# Patient Record
Sex: Male | Born: 1940 | Race: White | Hispanic: No | Marital: Married | Smoking: Former smoker
Health system: Southern US, Community
[De-identification: ages and names within clinical notes are randomized; demographics above are authoritative.]

## PROBLEM LIST (undated history)

## (undated) DIAGNOSIS — N471 Phimosis: Secondary | ICD-10-CM

## (undated) DIAGNOSIS — Z87828 Personal history of other (healed) physical injury and trauma: Secondary | ICD-10-CM

## (undated) DIAGNOSIS — I10 Essential (primary) hypertension: Secondary | ICD-10-CM

## (undated) DIAGNOSIS — N4889 Other specified disorders of penis: Secondary | ICD-10-CM

## (undated) DIAGNOSIS — R351 Nocturia: Secondary | ICD-10-CM

## (undated) DIAGNOSIS — C801 Malignant (primary) neoplasm, unspecified: Secondary | ICD-10-CM

## (undated) DIAGNOSIS — D649 Anemia, unspecified: Secondary | ICD-10-CM

## (undated) DIAGNOSIS — Z972 Presence of dental prosthetic device (complete) (partial): Secondary | ICD-10-CM

## (undated) DIAGNOSIS — R7303 Prediabetes: Secondary | ICD-10-CM

## (undated) HISTORY — DX: Anemia, unspecified: D64.9

## (undated) HISTORY — DX: Malignant (primary) neoplasm, unspecified: C80.1

---

## 2005-08-05 ENCOUNTER — Emergency Department (HOSPITAL_COMMUNITY): Admission: EM | Admit: 2005-08-05 | Discharge: 2005-08-05 | Payer: Self-pay | Admitting: *Deleted

## 2005-08-10 ENCOUNTER — Encounter: Admission: RE | Admit: 2005-08-10 | Discharge: 2005-08-10 | Payer: Self-pay | Admitting: Neurosurgery

## 2016-11-04 ENCOUNTER — Emergency Department (HOSPITAL_COMMUNITY)
Admission: EM | Admit: 2016-11-04 | Discharge: 2016-11-04 | Disposition: A | Discharge status: Home / Self Care | Payer: Medicare Other | Attending: Emergency Medicine | Admitting: Emergency Medicine

## 2016-11-04 ENCOUNTER — Encounter (HOSPITAL_COMMUNITY): Payer: Self-pay | Admitting: *Deleted

## 2016-11-04 DIAGNOSIS — N471 Phimosis: Secondary | ICD-10-CM | POA: Insufficient documentation

## 2016-11-04 DIAGNOSIS — N481 Balanitis: Secondary | ICD-10-CM

## 2016-11-04 DIAGNOSIS — N3000 Acute cystitis without hematuria: Secondary | ICD-10-CM | POA: Diagnosis not present

## 2016-11-04 DIAGNOSIS — N4889 Other specified disorders of penis: Secondary | ICD-10-CM | POA: Diagnosis present

## 2016-11-04 HISTORY — DX: Essential (primary) hypertension: I10

## 2016-11-04 LAB — URINALYSIS, ROUTINE W REFLEX MICROSCOPIC
Bilirubin Urine: NEGATIVE
Glucose, UA: NEGATIVE mg/dL
Hgb urine dipstick: NEGATIVE
Ketones, ur: NEGATIVE mg/dL
Nitrite: NEGATIVE
PROTEIN: 30 mg/dL — AB
Specific Gravity, Urine: 1.02 (ref 1.005–1.030)
pH: 5 (ref 5.0–8.0)

## 2016-11-04 MED ORDER — CEPHALEXIN 500 MG PO CAPS: 500.0000 mg | ORAL_CAPSULE | Freq: Four times a day (QID) | ORAL | 0 refills | Status: AC

## 2016-11-04 MED ORDER — CEPHALEXIN 250 MG PO CAPS
500.0000 mg | ORAL_CAPSULE | Freq: Once | ORAL | Status: AC
  Administered 2016-11-04: 500 mg via ORAL
  Filled 2016-11-04: qty 2

## 2016-11-04 MED ORDER — METRONIDAZOLE 500 MG PO TABS
500.0000 mg | ORAL_TABLET | Freq: Once | ORAL | Status: AC
  Administered 2016-11-04: 500 mg via ORAL
  Filled 2016-11-04: qty 1

## 2016-11-04 MED ORDER — METRONIDAZOLE 500 MG PO TABS: 500.0000 mg | ORAL_TABLET | Freq: Two times a day (BID) | ORAL | 0 refills | Status: AC

## 2016-11-04 NOTE — ED Notes (Signed)
PT states understanding of care given, follow up care, and medication prescribed. PT ambulated from ED to car with a steady gait. 

## 2016-11-04 NOTE — ED Triage Notes (Signed)
Pt is here with urinary symptoms and pain with urination.  Pt states thinks may have infection to penis since he is uncircumcised. No abdominal pain.

## 2016-11-04 NOTE — ED Provider Notes (Signed)
[facility] DEPT Provider Note   CSN: [id number] Arrival date & time: 11/04/16  1147     History   Chief Complaint Chief Complaint  Patient presents with  . URINARY SYMPTOMS AND PENIAL PAIN    HPI Thomas Wagner is a 76 y.o. male.  The history is provided by the patient, medical records and the spouse.  Dysuria   This is a new problem. The current episode started more than 2 days ago. The problem occurs every urination. The problem has not changed since onset.The quality of the pain is described as burning. The pain is moderate. There has been no fever. Pertinent negatives include no chills, no nausea, no vomiting, no discharge, no frequency, no hematuria, no hesitancy, no urgency and no flank pain. He has tried nothing for the symptoms. His past medical history does not include urological procedure or recurrent UTIs.    Past Medical History:  Diagnosis Date  . Hypertension     There are no active problems to display for this patient.   History reviewed. No pertinent surgical history.     Home Medications    Prior to Admission medications   Not on File    Family History No family history on file.  Social History Social History  Substance Use Topics  . Smoking status: Never Smoker  . Smokeless tobacco: Never Used  . Alcohol use No     Allergies   Patient has no known allergies.   Review of Systems Review of Systems  Constitutional: Negative for chills, diaphoresis and fever.  HENT: Negative for congestion.   Eyes: Negative for visual disturbance.  Respiratory: Negative for cough, chest tightness, shortness of breath and wheezing.   Cardiovascular: Negative for chest pain and palpitations.  Gastrointestinal: Negative for abdominal pain, constipation, diarrhea, nausea and vomiting.  Genitourinary: Positive for dysuria, penile pain and penile swelling. Negative for decreased urine volume, difficulty urinating, discharge, flank pain, frequency, genital  sores, hematuria, hesitancy, scrotal swelling, testicular pain and urgency.  Musculoskeletal: Negative for back pain, neck pain and neck stiffness.  Skin: Negative for rash and wound.  Neurological: Negative for headaches.  Psychiatric/Behavioral: Negative for agitation.  All other systems reviewed and are negative.    Physical Exam Updated Vital Signs BP (!) 190/90 (BP Location: Right Arm)   Pulse 80   Temp 98.5 F (36.9 C) (Oral)   Resp 18   SpO2 98%   Physical Exam  Constitutional: He appears well-developed and well-nourished. No distress.  HENT:  Head: Normocephalic and atraumatic.  Mouth/Throat: Oropharynx is clear and moist. No oropharyngeal exudate.  Eyes: Conjunctivae are normal.  Neck: Neck supple.  Cardiovascular: Normal rate and intact distal pulses.   No murmur heard. Pulmonary/Chest: Effort normal and breath sounds normal. No respiratory distress. He has no wheezes. He exhibits no tenderness.  Abdominal: Soft. There is no tenderness. Hernia confirmed negative in the right inguinal area and confirmed negative in the left inguinal area.  Genitourinary: Right testis shows no tenderness. Left testis shows no tenderness. Uncircumcised. Phimosis and penile tenderness present. No penile erythema. No discharge found.     Musculoskeletal: He exhibits no edema or tenderness.  Lymphadenopathy: No inguinal adenopathy noted on the right or left side.  Neurological: He is alert. No sensory deficit. He exhibits normal muscle tone.  Skin: Skin is warm and dry. Capillary refill takes less than 2 seconds. He is not diaphoretic. No erythema. No pallor.  Psychiatric: He has a normal mood and affect.  Nursing  note and vitals reviewed.    ED Treatments / Results  Labs (all labs ordered are listed, but only abnormal results are displayed) Labs Reviewed  URINALYSIS, ROUTINE W REFLEX MICROSCOPIC - Abnormal; Notable for the following:       Result Value   Protein, ur 30 (*)     Leukocytes, UA LARGE (*)    Bacteria, UA RARE (*)    Squamous Epithelial / LPF 0-5 (*)    All other components within normal limits    EKG  EKG Interpretation None       Radiology No results found.  Procedures Procedures (including critical care time)  Medications Ordered in ED Medications  cephALEXin (KEFLEX) capsule 500 mg (500 mg Oral Given 11/04/16 1640)  metroNIDAZOLE (FLAGYL) tablet 500 mg (500 mg Oral Given 11/04/16 1640)     Initial Impression / Assessment and Plan / ED Course  I have reviewed the triage vital signs and the nursing notes.  Pertinent labs & imaging results that were available during my care of the patient were reviewed by me and considered in my medical decision making (see chart for details).     Thomas Wagner is a 76 y.o. male with a past medical history significant for hypertension who presents with 1 week of dysuria and 6 weeks of swelling in his penis. Patient reports that he noticed a knot in his penis for the last 6 weeks. He says it is very tender and it occasionally bleeds when he tries to clean under his uncircumcised foreskin. He reports that over the last week he has had burning when he PEs at every urination. He says that it burned worse yesterday but is slightly improved today. He denies fevers, chills, nausea vomiting, abdominal pain, chest pain. He denies any productive cough or bowel symptoms. He denies any recent reticulocyte injuries. He denies any history of testicular injuries or abnormalities. He denies other complaints on arrival.  On exam, patient has no tenderness in the scrotum. No evidence of hernias. No abdominal tenderness. Lungs clear and chest nontender. No lower extremity abnormality. Patient had no tenderness of the shaft of the penis but had tenderness near the glans. Patient's foreskin was unable to be retracted. Patient says that he has not retracted his foreskin in "years". He denies any discharge but says that when he is  trying to clean under it bleeds. He reports it is tender when the glans is pressed through the foreskin and he occasionally feels a knot. No "knot" was palpated on my exam but there was tenderness of the glans. Exam was otherwise unremarkable. No erythema or ulcer seen. No evidence of Fournier's.  Based on exam, suspect patient has a balanitis and urinary tract infection. Patient also has a phimosis which is likely chronic as he reports he is not pulled back his foreskin in over a year.   Patient will be given prescription for antibiotics to treat both UTI and balanitis. Patient is still able to urinate normally, do not feel he needs urgent or emergent urological surgery consultation today however he does need evaluation by urology in clinic for further management recommendations. Patient reports that he will continue trying to clean the area and take his antibodies. Patient understood return precautions if he is unable to urinate or has worsening symptoms of infection. Next  Patient family understood the plan of care had no other questions or concerns. Patient discharged in good condition.    Final Clinical Impressions(s) / ED Diagnoses  Final diagnoses:  Acute cystitis without hematuria  Balanitis  Phimosis    New Prescriptions Discharge Medication List as of 11/04/2016  4:22 PM    START taking these medications   Details  cephALEXin (KEFLEX) 500 MG capsule Take 1 capsule (500 mg total) by mouth 4 (four) times daily., Starting Thu 11/04/2016, Until Thu 11/11/2016, Print    metroNIDAZOLE (FLAGYL) 500 MG tablet Take 1 tablet (500 mg total) by mouth 2 (two) times daily., Starting Thu 11/04/2016, Until Thu 11/11/2016, Print        Clinical Impression: 1. Acute cystitis without hematuria   2. Balanitis   3. Phimosis     Disposition: Discharge  Condition: Good  I have discussed the results, Dx and Tx plan with the pt(& family if present). He/she/they expressed understanding and agree(s)  with the plan. Discharge instructions discussed at great length. Strict return precautions discussed and pt &/or family have verbalized understanding of the instructions. No further questions at time of discharge.    Discharge Medication List as of 11/04/2016  4:22 PM    START taking these medications   Details  cephALEXin (KEFLEX) 500 MG capsule Take 1 capsule (500 mg total) by mouth 4 (four) times daily., Starting Thu 11/04/2016, Until Thu 11/11/2016, Print    metroNIDAZOLE (FLAGYL) 500 MG tablet Take 1 tablet (500 mg total) by mouth 2 (two) times daily., Starting Thu 11/04/2016, Until Thu 11/11/2016, Print        Follow Up: [place] [phone]    [facility] [place], [place] [id number] [phone]    [facility] EMERGENCY DEPARTMENT [address] [id number] mc [place] [phone]  If symptoms worsen       Evey Mcmahan, Gwenyth Allegra, MD 11/04/16 [phone]

## 2016-11-04 NOTE — Discharge Instructions (Signed)
Your workup today shows evidence of urinary tract infection and balanitis, inflammation/infection at the tip of the penis. You need to take her antibiotics and follow-up with a urologist. We are giving a referrals to 2 of the [facility] clinics to be called. You may also go to any urologist closer to your home. If any symptoms change or worsen or you begin developing any symptoms of systemic infection, please return to the nearest emergency department. Please try and keep your penis clean.

## 2016-12-02 ENCOUNTER — Other Ambulatory Visit: Payer: Self-pay | Admitting: Urology

## 2016-12-21 ENCOUNTER — Encounter (HOSPITAL_BASED_OUTPATIENT_CLINIC_OR_DEPARTMENT_OTHER): Payer: Self-pay | Admitting: *Deleted

## 2016-12-24 ENCOUNTER — Encounter (HOSPITAL_BASED_OUTPATIENT_CLINIC_OR_DEPARTMENT_OTHER): Payer: Self-pay | Admitting: *Deleted

## 2016-12-24 NOTE — Progress Notes (Signed)
NPO AFTER MN.  ARRIVE AT [id number].  NEEDS ISTAT 8 AND EKG.

## 2016-12-30 DIAGNOSIS — C801 Malignant (primary) neoplasm, unspecified: Secondary | ICD-10-CM

## 2016-12-30 HISTORY — DX: Malignant (primary) neoplasm, unspecified: C80.1

## 2016-12-30 NOTE — H&P (Signed)
HPI: Thomas Wagner is a 76 year-old male with phimosis and a penile mass.  He first noticed the symptoms 09/18/2016. His symptoms did begin gradually.   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 He does have difficulty retracting his foreskin.   11/23/16: He was seen in the ER on 11/04/16 with a one-week history of dysuria and a 6 week history of what he described as penile swelling. The dysuria was moderate in severity and he was found on exam to have a significant phimosis that he reported had been like that for several years. He was treated with an antibiotic for balanitis.  He also reported having a "knot" felt in the penis but no mass was palpable on exam in the ER. He was found to be tender on examination of the bladder and beneath the foreskin.     CC/HPI: I have trouble rolling my foreskin back.     11/23/16: He told me that he had not been able to retract his foreskin for many years. He states that he does not have diabetes. He said he used to have to clean inside the foreskin with a Q-tip and after doing so he saw some blood and felt a knot on the head of the penis beneath the foreskin. That was about 2 months ago. He said he believes it has varied in size. He denies any penile discharge. He has been placed on metronidazole, Keflex and clotrimazole.    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   Tobacco Use Assessment Completed: Used  Tobacco in last 30 days? Does not drink anymore.  Does not drink caffeine.    REVIEW OF SYSTEMS:    GU Review Male:   Patient reports get up at night to urinate and stream starts and stops. Patient denies frequent urination, hard to postpone urination, burning/ pain with urination, leakage of urine, trouble starting your stream, have to strain to urinate , erection problems, and penile pain.  Gastrointestinal (Upper):   Patient denies nausea, vomiting, and indigestion/ heartburn.  Gastrointestinal (Lower):   Patient denies diarrhea and constipation.  Constitutional:   Patient denies fever, night sweats, weight loss, and fatigue.  Skin:   Patient denies skin rash/ lesion and itching.  Eyes:   Patient denies blurred vision and double vision.  Ears/ Nose/ Throat:   Patient denies sore throat and sinus problems.  Hematologic/Lymphatic:   Patient reports easy bruising. Patient denies swollen glands.  Cardiovascular:   Patient denies leg swelling and chest pains.  Respiratory:   Patient denies cough and shortness of breath.  Endocrine:   Patient denies excessive thirst.  Musculoskeletal:   Patient denies back pain and joint pain.  Neurological:   Patient denies headaches and dizziness.  Psychologic:   Patient denies depression and anxiety.   VITAL SIGNS:     Weight 180 lb / 81.65 kg  Height 73 in / 185.42 cm  BP  146/79 mmHg  Pulse 82 /min  Temperature 98.1 F / 36.7 C  BMI 23.7 kg/m   GU PHYSICAL EXAMINATION:    Scrotum: No lesions. No edema. No cysts. No warts.  Epididymides: Right: no spermatocele, no masses, no cysts, no tenderness, no induration, no enlargement. Left: no spermatocele, no masses, no cysts, no tenderness, no induration, no enlargement.  Testes: No tenderness, no swelling, no enlargement left testes. No tenderness, no swelling, no enlargement right testes. Normal location left testes. Normal location right testes. No mass, no cyst, no varicocele, no hydrocele left testes.  No mass, no cyst, no varicocele, no hydrocele right testes.  Urethral Meatus: Normal size. No lesion, no wart, no discharge, no polyp. Normal location.  Penis: Penis uncircumcised, phimosis. 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 There is a 1 x 1.5 cm mass felt on the glans at about the 11 o'clock position. It seems fixed to the glans. It is nontender. There is no discharge.    MULTI-SYSTEM PHYSICAL EXAMINATION:    Constitutional: Well-nourished. No physical deformities. Normally developed. Good grooming.  Neck: Neck symmetrical, not swollen. Normal tracheal position.  Respiratory: No labored breathing, no use of accessory muscles.   Cardiovascular: Normal temperature, normal extremity pulses, no swelling, no varicosities.  Lymphatic: No enlargement of neck, axillae, groin.  Skin: No paleness, no jaundice, no cyanosis. No lesion, no ulcer, no rash.  Neurologic / Psychiatric: Oriented to time, oriented to place, oriented to person. No depression, no anxiety, no agitation.  Gastrointestinal: No mass, no tenderness, no rigidity, non obese abdomen.  Eyes: Normal conjunctivae. Normal eyelids.  Ears, Nose, Mouth, and Throat: Left ear no scars, no lesions, no masses. Right ear no scars, no lesions, no masses. Nose no scars, no lesions, no masses. Normal hearing. Normal lips.  Musculoskeletal: Normal gait and station of head and neck.     PAST DATA REVIEWED:  Source Of History:  Patient  Records Review:   Previous Hospital Records  Urine Test Review:   Urinalysis  Notes:                     His urinalysis in the emergency room was unremarkable other than TNTC wbc's.   PROCEDURES:          Urinalysis w/Scope Dipstick Dipstick Cont'd Micro  Color: Yellow Bilirubin: Neg WBC/hpf: 0 - 5/hpf  Appearance: Clear Ketones: Neg RBC/hpf: NS (Not Seen)  Specific Gravity: 1.025 Blood: Neg Bacteria: NS (Not Seen)  pH: 6.0 Protein: Trace Cystals: NS (Not Seen)   Glucose: Neg Urobilinogen: 1.0 Casts: NS (Not Seen)    Nitrites: Neg Trichomonas: Not Present    Leukocyte Esterase: Trace Mucous: Not Present      Epithelial Cells: 0 - 5/hpf      Yeast: NS (Not Seen)      Sperm: Not Present    ASSESSMENT/PLAN:     ICD-10 Details  1 GU:   Phimosis - N47.1 He has a severe phimosis that has been present for many years. 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 Because of a mass I have recommended circumcision for visualization and further management.  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 I cannot tell if there is any involvement of the glans as it cannot be visualized.  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 I discussed that with him. I told him that this would need to be biopsied once I was able to visualize the glans.   I went over the procedure planned with him in detail including the incision used, the risks and, occasions, the probability of success, the outpatient nature of the procedure as well as the anticipated postoperative course.

## 2017-01-03 ENCOUNTER — Ambulatory Visit (HOSPITAL_BASED_OUTPATIENT_CLINIC_OR_DEPARTMENT_OTHER): Payer: Medicare Other | Admitting: Anesthesiology

## 2017-01-03 ENCOUNTER — Encounter (HOSPITAL_BASED_OUTPATIENT_CLINIC_OR_DEPARTMENT_OTHER): Payer: Self-pay | Admitting: Anesthesiology

## 2017-01-03 ENCOUNTER — Encounter (HOSPITAL_BASED_OUTPATIENT_CLINIC_OR_DEPARTMENT_OTHER)
Admission: RE | Disposition: A | Discharge status: Home / Self Care | Payer: Self-pay | Source: Ambulatory Visit | Attending: Urology

## 2017-01-03 ENCOUNTER — Ambulatory Visit (HOSPITAL_BASED_OUTPATIENT_CLINIC_OR_DEPARTMENT_OTHER)
Admission: RE | Admit: 2017-01-03 | Discharge: 2017-01-03 | Disposition: A | Discharge status: Home / Self Care | Payer: Medicare Other | Source: Ambulatory Visit | Attending: Urology | Admitting: Urology

## 2017-01-03 DIAGNOSIS — Z85828 Personal history of other malignant neoplasm of skin: Secondary | ICD-10-CM | POA: Diagnosis not present

## 2017-01-03 DIAGNOSIS — Z79899 Other long term (current) drug therapy: Secondary | ICD-10-CM | POA: Insufficient documentation

## 2017-01-03 DIAGNOSIS — I10 Essential (primary) hypertension: Secondary | ICD-10-CM | POA: Insufficient documentation

## 2017-01-03 DIAGNOSIS — N480 Leukoplakia of penis: Secondary | ICD-10-CM | POA: Insufficient documentation

## 2017-01-03 DIAGNOSIS — C600 Malignant neoplasm of prepuce: Secondary | ICD-10-CM | POA: Insufficient documentation

## 2017-01-03 DIAGNOSIS — Z87891 Personal history of nicotine dependence: Secondary | ICD-10-CM | POA: Diagnosis not present

## 2017-01-03 DIAGNOSIS — N471 Phimosis: Secondary | ICD-10-CM | POA: Diagnosis not present

## 2017-01-03 HISTORY — DX: Nocturia: R35.1

## 2017-01-03 HISTORY — PX: CIRCUMCISION: SHX1350

## 2017-01-03 HISTORY — DX: Prediabetes: R73.03

## 2017-01-03 HISTORY — DX: Personal history of other (healed) physical injury and trauma: Z87.828

## 2017-01-03 HISTORY — DX: Phimosis: N47.1

## 2017-01-03 HISTORY — DX: Other specified disorders of penis: N48.89

## 2017-01-03 HISTORY — DX: Presence of dental prosthetic device (complete) (partial): Z97.2

## 2017-01-03 LAB — POCT I-STAT, CHEM 8
BUN: 9 mg/dL (ref 6–20)
Calcium, Ion: 1.14 mmol/L — ABNORMAL LOW (ref 1.15–1.40)
Chloride: 94 mmol/L — ABNORMAL LOW (ref 101–111)
Creatinine, Ser: 1 mg/dL (ref 0.61–1.24)
Glucose, Bld: 109 mg/dL — ABNORMAL HIGH (ref 65–99)
HCT: 49 % (ref 39.0–52.0)
Hemoglobin: 16.7 g/dL (ref 13.0–17.0)
Potassium: 4.1 mmol/L (ref 3.5–5.1)
Sodium: 133 mmol/L — ABNORMAL LOW (ref 135–145)
TCO2: 26 mmol/L (ref 22–32)

## 2017-01-03 SURGERY — CIRCUMCISION, ADULT
Anesthesia: General | Site: Penis

## 2017-01-03 MED ORDER — HYDROMORPHONE HCL 1 MG/ML IJ SOLN
0.2500 mg | INTRAMUSCULAR | Status: DC | PRN
  Filled 2017-01-03: qty 0.5

## 2017-01-03 MED ORDER — BUPIVACAINE HCL 0.5 % IJ SOLN
INTRAMUSCULAR | Status: DC | PRN
Start: 2017-01-03 — End: 2017-01-03
  Administered 2017-01-03: 10 mL

## 2017-01-03 MED ORDER — DEXAMETHASONE SODIUM PHOSPHATE 10 MG/ML IJ SOLN
INTRAMUSCULAR | Status: DC | PRN
  Administered 2017-01-03: 10 mg via INTRAVENOUS

## 2017-01-03 MED ORDER — ONDANSETRON HCL 4 MG/2ML IJ SOLN
INTRAMUSCULAR | Status: DC | PRN
  Administered 2017-01-03: 4 mg via INTRAVENOUS

## 2017-01-03 MED ORDER — PROPOFOL 10 MG/ML IV BOLUS
INTRAVENOUS | Status: AC
  Filled 2017-01-03: qty 20

## 2017-01-03 MED ORDER — CEFAZOLIN SODIUM-DEXTROSE 2-4 GM/100ML-% IV SOLN
2.0000 g | Freq: Once | INTRAVENOUS | Status: AC
  Administered 2017-01-03: 2 g via INTRAVENOUS
  Filled 2017-01-03: qty 100

## 2017-01-03 MED ORDER — PROPOFOL 10 MG/ML IV BOLUS
INTRAVENOUS | Status: DC | PRN
  Administered 2017-01-03: 160 mg via INTRAVENOUS

## 2017-01-03 MED ORDER — LIDOCAINE 2% (20 MG/ML) 5 ML SYRINGE
INTRAMUSCULAR | Status: AC
  Filled 2017-01-03: qty 5

## 2017-01-03 MED ORDER — CEFAZOLIN SODIUM-DEXTROSE 2-4 GM/100ML-% IV SOLN
INTRAVENOUS | Status: AC
  Filled 2017-01-03: qty 100

## 2017-01-03 MED ORDER — MIDAZOLAM HCL 2 MG/2ML IJ SOLN
INTRAMUSCULAR | Status: DC | PRN
  Administered 2017-01-03: 1 mg via INTRAVENOUS

## 2017-01-03 MED ORDER — FENTANYL CITRATE (PF) 100 MCG/2ML IJ SOLN
INTRAMUSCULAR | Status: DC | PRN
  Administered 2017-01-03: 25 ug via INTRAVENOUS
  Administered 2017-01-03: 50 ug via INTRAVENOUS

## 2017-01-03 MED ORDER — LIDOCAINE 2% (20 MG/ML) 5 ML SYRINGE
INTRAMUSCULAR | Status: DC | PRN
  Administered 2017-01-03: 60 mg via INTRAVENOUS

## 2017-01-03 MED ORDER — FENTANYL CITRATE (PF) 100 MCG/2ML IJ SOLN
INTRAMUSCULAR | Status: AC
  Filled 2017-01-03: qty 2

## 2017-01-03 MED ORDER — 0.9 % SODIUM CHLORIDE (POUR BTL) OPTIME
TOPICAL | Status: DC | PRN
  Administered 2017-01-03: 500 mL

## 2017-01-03 MED ORDER — BACITRACIN-NEOMYCIN-POLYMYXIN 400-5-5000 EX OINT
TOPICAL_OINTMENT | CUTANEOUS | Status: DC | PRN
  Administered 2017-01-03: 1 via TOPICAL

## 2017-01-03 MED ORDER — DEXAMETHASONE SODIUM PHOSPHATE 10 MG/ML IJ SOLN
INTRAMUSCULAR | Status: AC
  Filled 2017-01-03: qty 1

## 2017-01-03 MED ORDER — MIDAZOLAM HCL 2 MG/2ML IJ SOLN
INTRAMUSCULAR | Status: AC
  Filled 2017-01-03: qty 2

## 2017-01-03 MED ORDER — ONDANSETRON HCL 4 MG/2ML IJ SOLN
INTRAMUSCULAR | Status: AC
  Filled 2017-01-03: qty 2

## 2017-01-03 MED ORDER — PROMETHAZINE HCL 25 MG/ML IJ SOLN
6.2500 mg | INTRAMUSCULAR | Status: DC | PRN
  Filled 2017-01-03: qty 1

## 2017-01-03 MED ORDER — LACTATED RINGERS IV SOLN
INTRAVENOUS | Status: DC
  Administered 2017-01-03: 10:00:00 via INTRAVENOUS
  Filled 2017-01-03: qty 1000

## 2017-01-03 MED ORDER — OXYCODONE HCL 5 MG PO CAPS: 5.0000 mg | ORAL_CAPSULE | ORAL | 0 refills | Status: DC | PRN

## 2017-01-03 SURGICAL SUPPLY — 32 items
BANDAGE CO FLEX L/F 1IN X 5YD (GAUZE/BANDAGES/DRESSINGS) IMPLANT
BANDAGE CO FLEX L/F 2IN X 5YD (GAUZE/BANDAGES/DRESSINGS) ×2 IMPLANT
BLADE SURG 15 STRL LF DISP TIS (BLADE) ×1 IMPLANT
BLADE SURG 15 STRL SS (BLADE) ×2
COVER BACK TABLE 60X90IN (DRAPES) ×2 IMPLANT
COVER MAYO STAND STRL (DRAPES) ×2 IMPLANT
DRAIN PENROSE 18X1/4 LTX STRL (WOUND CARE) IMPLANT
DRAPE LAPAROTOMY 100X72 PEDS (DRAPES) ×2 IMPLANT
ELECT REM PT RETURN 9FT ADLT (ELECTROSURGICAL) ×2
ELECTRODE REM PT RTRN 9FT ADLT (ELECTROSURGICAL) ×1 IMPLANT
GAUZE SPONGE 4X4 12PLY STRL LF (GAUZE/BANDAGES/DRESSINGS) ×1 IMPLANT
GLOVE BIO SURGEON STRL SZ7 (GLOVE) ×1 IMPLANT
GLOVE BIO SURGEON STRL SZ7.5 (GLOVE) ×1 IMPLANT
GLOVE BIO SURGEON STRL SZ8 (GLOVE) ×2 IMPLANT
GLOVE BIOGEL PI IND STRL 7.5 (GLOVE) IMPLANT
GLOVE BIOGEL PI INDICATOR 7.5 (GLOVE) ×1
GOWN STRL REUS W/ TWL LRG LVL3 (GOWN DISPOSABLE) ×1 IMPLANT
GOWN STRL REUS W/ TWL XL LVL3 (GOWN DISPOSABLE) ×1 IMPLANT
GOWN STRL REUS W/TWL LRG LVL3 (GOWN DISPOSABLE) ×3 IMPLANT
GOWN STRL REUS W/TWL XL LVL3 (GOWN DISPOSABLE) ×4 IMPLANT
KIT RM TURNOVER CYSTO AR (KITS) ×2 IMPLANT
NEEDLE HYPO 22GX1.5 SAFETY (NEEDLE) ×2 IMPLANT
NS IRRIG 500ML POUR BTL (IV SOLUTION) ×1 IMPLANT
PACK BASIN DAY SURGERY FS (CUSTOM PROCEDURE TRAY) ×2 IMPLANT
PENCIL BUTTON HOLSTER BLD 10FT (ELECTRODE) ×2 IMPLANT
SUT CHROMIC 3 0 SH 27 (SUTURE) ×4 IMPLANT
SUT CHROMIC 4 0 RB 1X27 (SUTURE) IMPLANT
SUT CHROMIC 5 0 RB 1 27 (SUTURE) ×1 IMPLANT
SYR CONTROL 10ML LL (SYRINGE) ×2 IMPLANT
TOWEL OR 17X24 6PK STRL BLUE (TOWEL DISPOSABLE) ×4 IMPLANT
TRAY DSU PREP LF (CUSTOM PROCEDURE TRAY) ×2 IMPLANT
TUBE CONNECTING 12X1/4 (SUCTIONS) IMPLANT

## 2017-01-03 NOTE — Discharge Instructions (Signed)
Postoperative instructions for circumcision  Wound:  In most cases your incision will have absorbable sutures that run along the course of your incision and will dissolve within the first 10-20 days. Some will fall out even earlier. Expect some redness as the sutures dissolved but this should occur only around the sutures. If there is generalized redness, especially with increasing pain or swelling, let us know. The penis will very likely get "black and blue" as the blood in the tissues spread. Sometimes the whole penis will turn colors. The black and blue is followed by a yellow and brown color. In time, all the discoloration will go away.  Diet:  You may return to your normal diet within 24 hours following your surgery. You may note some mild nausea and possibly vomiting the first 6-8 hours following surgery. This is usually due to the side effects of anesthesia, and will disappear quite soon. I would suggest clear liquids and a very light meal the first evening following your surgery.  Activity:  Your physical activity should be restricted the first 48 hours. During that time you should remain relatively inactive, moving about only when necessary. During the first 7-10 days following surgery he should avoid lifting any heavy objects (anything greater than 15 pounds), and avoid strenuous exercise. If you work, ask [place] specifically about your restrictions, both for work and home. We will write a note to your employer if needed.  Ice packs can be placed on and off over the penis for the first 48 hours to help relieve the pain and keep the swelling down. Frozen peas or corn in a ZipLock bag can be frozen, used and re-frozen. Fifteen minutes on and 15 minutes off is a reasonable schedule.   Hygiene:  You may shower 48 hours after your surgery. Tub bathing should be restricted until the seventh day.  Medication:  You will be sent home with some type of pain medication. In many cases you will be  sent home with a narcotic pain pill (Vicodin or Tylox). If the pain is not too bad, you may take either Tylenol (acetaminophen) or Advil (ibuprofen) which contain no narcotic agents, and might be tolerated a little better, with fewer side effects. If the pain medication you are sent home with does not control the pain, you will have to let us know. Some narcotic pain medications cannot be given or refilled by a phone call to a pharmacy.  Problems you should report to [place]:   Fever of 101.0 degrees Fahrenheit or greater.  Moderate or severe swelling under the skin incision or involving the scrotum.  Drug reaction such as hives, a rash, nausea or vomiting.    Post Anesthesia Home Care Instructions  Activity: Get plenty of rest for the remainder of the day. A responsible individual must stay with you for 24 hours following the procedure.  For the next 24 hours, DO NOT: -Drive a car -Paediatric nurse -Drink alcoholic beverages -Take any medication unless instructed by your physician -Make any legal decisions or sign important papers.  Meals: Start with liquid foods such as gelatin or soup. Progress to regular foods as tolerated. Avoid greasy, spicy, heavy foods. If nausea and/or vomiting occur, drink only clear liquids until the nausea and/or vomiting subsides. Call your physician if vomiting continues.  Special Instructions/Symptoms: Your throat may feel dry or sore from the anesthesia or the breathing tube placed in your throat during surgery. If this causes discomfort, gargle with warm salt water. The discomfort  should disappear within 24 hours.  If you had a scopolamine patch placed behind your ear for the management of post- operative nausea and/or vomiting:  1. The medication in the patch is effective for 72 hours, after which it should be removed.  Wrap patch in a tissue and discard in the trash. Wash hands thoroughly with soap and water. 2. You may remove the patch earlier than 72  hours if you experience unpleasant side effects which may include dry mouth, dizziness or visual disturbances. 3. Avoid touching the patch. Wash your hands with soap and water after contact with the patch.    Post Anesthesia Home Care Instructions  Activity: Get plenty of rest for the remainder of the day. A responsible individual must stay with you for 24 hours following the procedure.  For the next 24 hours, DO NOT: -Drive a car -Paediatric nurse -Drink alcoholic beverages -Take any medication unless instructed by your physician -Make any legal decisions or sign important papers.  Meals: Start with liquid foods such as gelatin or soup. Progress to regular foods as tolerated. Avoid greasy, spicy, heavy foods. If nausea and/or vomiting occur, drink only clear liquids until the nausea and/or vomiting subsides. Call your physician if vomiting continues.  Special Instructions/Symptoms: Your throat may feel dry or sore from the anesthesia or the breathing tube placed in your throat during surgery. If this causes discomfort, gargle with warm salt water. The discomfort should disappear within 24 hours.  If you had a scopolamine patch placed behind your ear for the management of post- operative nausea and/or vomiting:  1. The medication in the patch is effective for 72 hours, after which it should be removed.  Wrap patch in a tissue and discard in the trash. Wash hands thoroughly with soap and water. 2. You may remove the patch earlier than 72 hours if you experience unpleasant side effects which may include dry mouth, dizziness or visual disturbances. 3. Avoid touching the patch. Wash your hands with soap and water after contact with the patch.

## 2017-01-03 NOTE — Anesthesia Procedure Notes (Signed)
Procedure Name: LMA Insertion Date/Time: 01/03/2017 11:05 AM Performed by: Suan Halter, CRNA Pre-anesthesia Checklist: Patient identified, Emergency Drugs available, Suction available and Patient being monitored Patient Re-evaluated:Patient Re-evaluated prior to induction Oxygen Delivery Method: Circle system utilized Preoxygenation: Pre-oxygenation with 100% oxygen Induction Type: IV induction Ventilation: Mask ventilation without difficulty LMA: LMA inserted LMA Size: 4.0 Number of attempts: 1 Airway Equipment and Method: Bite block Placement Confirmation: positive ETCO2 Tube secured with: Tape Dental Injury: Teeth and Oropharynx as per pre-operative assessment

## 2017-01-03 NOTE — Anesthesia Postprocedure Evaluation (Signed)
Anesthesia Post Note  Patient: Thomas Wagner  Procedure(s) Performed: CIRCUMCISION ADULT/ POSSIBLE PENILE BIOPSY (N/A Penis)     Patient location during evaluation: PACU Anesthesia Type: General Level of consciousness: sedated Pain management: pain level controlled Vital Signs Assessment: post-procedure vital signs reviewed and stable Respiratory status: spontaneous breathing and respiratory function stable Cardiovascular status: stable Postop Assessment: no apparent nausea or vomiting Anesthetic complications: no    Last Vitals:  Vitals:   01/03/17 1245 01/03/17 1300  BP: 136/80 134/74  Pulse: 77 79  Resp: 20 (!) 25  Temp:    SpO2: 95% 95%    Last Pain:  Vitals:   01/03/17 0837  TempSrc: Oral                 Butch Otterson DANIEL

## 2017-01-03 NOTE — Anesthesia Preprocedure Evaluation (Addendum)
Anesthesia Evaluation  Patient identified by MRN, date of birth, ID band Patient awake    Reviewed: Allergy & Precautions, NPO status , Patient's Chart, lab work & pertinent test results  History of Anesthesia Complications Negative for: history of anesthetic complications  Airway Mallampati: I  TM Distance: >3 FB Neck ROM: Full    Dental  (+) Upper Dentures, Dental Advisory Given   Pulmonary former smoker,    Pulmonary exam normal        Cardiovascular hypertension, Pt. on medications Normal cardiovascular exam     Neuro/Psych negative neurological ROS  negative psych ROS   GI/Hepatic negative GI ROS, Neg liver ROS,   Endo/Other  negative endocrine ROS  Renal/GU   negative genitourinary   Musculoskeletal negative musculoskeletal ROS (+)   Abdominal   Peds negative pediatric ROS (+)  Hematology negative hematology ROS (+)   Anesthesia Other Findings   Reproductive/Obstetrics negative OB ROS                           Anesthesia Physical Anesthesia Plan  ASA: II  Anesthesia Plan: General   Post-op Pain Management:    Induction: Intravenous  PONV Risk Score and Plan: 2 and Ondansetron, Dexamethasone and Treatment may vary due to age or medical condition  Airway Management Planned: LMA  Additional Equipment:   Intra-op Plan:   Post-operative Plan: Extubation in OR  Informed Consent: I have reviewed the patients History and Physical, chart, labs and discussed the procedure including the risks, benefits and alternatives for the proposed anesthesia with the patient or authorized representative who has indicated his/her understanding and acceptance.   Dental advisory given  Plan Discussed with: CRNA and Anesthesiologist  Anesthesia Plan Comments:        Anesthesia Quick Evaluation

## 2017-01-03 NOTE — Op Note (Signed)
PATIENT:  Thomas Wagner  PRE-OPERATIVE DIAGNOSIS: Phimosis, Penile lesion  POST-OPERATIVE DIAGNOSIS: Same  PROCEDURE: Circumcision  SURGEON:  Sequoyah Ramone  RESIDENT: Alla Feeling, MD, MD  INDICATION: Thomas Wagner is a 76 y.o. male with a history of phimosis and palpable penile lesion. Here today for circumcision. Risks and benefits discussed. Consent signed.   ANESTHESIA:  General  EBL:  Minimal  DRAINS: None  LOCAL MEDICATIONS USED: 5% Marcaine used as a dorsal penile block.  SPECIMEN:  Foreskin ( stitch marking penile lesion)   Description of procedure: After informed consent the patient was taken to the operating room and placed on the table in a supine position. General anesthesia was then administered. Once fully anesthetized the patient was moved to the supine position and the genitalia were sterilely prepped and draped in standard fashion. An official timeout was then performed.  We began by performing a dorsal slit, releasing the phimotic ring. We began the circumcision by making a circumferential incision 46mm proximal to the glans on the inner preputial skin, creating a preputial collar. We then measured, marked, and made our incision on the outer preputial skin. Next, using 4 hemostats on the dorsal aspect of the preputial skin for traction, we incised the preputial skin on the dorsal midline. Next, using electrocautery, we removed the preputial skin, ensuring not to cauterize the glans or penile skin. Once the foreskin was removed, we ensured excellent hemostasis and began closure.   Next, we re-approximated the inner and outer penile skin with 2 running 3-0 chromic sutures, one starting at 12 o'clock, and the other at 6 o'clock, running them counter clockwise.   After closure, we wrapped the penis in gauze, Kling, and Coban.   The patient tolerated the procedure well.    PLAN OF CARE: Discharge to home after PACU  PATIENT DISPOSITION:  PACU - hemodynamically  stable.   I was present, scrubbed in and assisted in all aspects of the surgery.

## 2017-01-03 NOTE — Transfer of Care (Signed)
Immediate Anesthesia Transfer of Care Note  Patient: Thomas Wagner  Procedure(s) Performed: Procedure(s) (LRB): CIRCUMCISION ADULT/ POSSIBLE PENILE BIOPSY (N/A)  Patient Location: PACU  Anesthesia Type: General  Level of Consciousness: awake, oriented, sedated and patient cooperative  Airway & Oxygen Therapy: Patient Spontanous Breathing and Patient connected to face mask oxygen  Post-op Assessment: Report given to PACU RN and Post -op Vital signs reviewed and stable  Post vital signs: Reviewed and stable  Complications: No apparent anesthesia complications  Last Vitals:  Vitals:   01/03/17 0837  BP: (!) 156/87  Pulse: 83  Resp: 18  Temp: 36.6 C  SpO2: 98%    Last Pain:  Vitals:   01/03/17 0837  TempSrc: Oral      Patients Stated Pain Goal: 5 (01/03/17 0922)

## 2017-01-04 ENCOUNTER — Encounter (HOSPITAL_BASED_OUTPATIENT_CLINIC_OR_DEPARTMENT_OTHER): Payer: Self-pay | Admitting: Urology

## 2017-01-11 ENCOUNTER — Other Ambulatory Visit: Payer: Self-pay

## 2017-01-11 ENCOUNTER — Encounter (HOSPITAL_COMMUNITY): Payer: Self-pay

## 2017-01-11 ENCOUNTER — Emergency Department (HOSPITAL_COMMUNITY): Payer: Medicare Other

## 2017-01-11 ENCOUNTER — Inpatient Hospital Stay (HOSPITAL_COMMUNITY)
Admission: EM | Admit: 2017-01-11 | Discharge: 2017-01-18 | DRG: 330 | Disposition: A | Discharge status: Home Health | Payer: Medicare Other | Attending: Internal Medicine | Admitting: Internal Medicine

## 2017-01-11 DIAGNOSIS — I10 Essential (primary) hypertension: Secondary | ICD-10-CM | POA: Diagnosis present

## 2017-01-11 DIAGNOSIS — Z800 Family history of malignant neoplasm of digestive organs: Secondary | ICD-10-CM

## 2017-01-11 DIAGNOSIS — R7303 Prediabetes: Secondary | ICD-10-CM | POA: Diagnosis present

## 2017-01-11 DIAGNOSIS — E861 Hypovolemia: Secondary | ICD-10-CM | POA: Diagnosis present

## 2017-01-11 DIAGNOSIS — Z95828 Presence of other vascular implants and grafts: Secondary | ICD-10-CM

## 2017-01-11 DIAGNOSIS — Z79899 Other long term (current) drug therapy: Secondary | ICD-10-CM

## 2017-01-11 DIAGNOSIS — Z87891 Personal history of nicotine dependence: Secondary | ICD-10-CM

## 2017-01-11 DIAGNOSIS — C799 Secondary malignant neoplasm of unspecified site: Secondary | ICD-10-CM

## 2017-01-11 DIAGNOSIS — D72829 Elevated white blood cell count, unspecified: Secondary | ICD-10-CM | POA: Diagnosis present

## 2017-01-11 DIAGNOSIS — C187 Malignant neoplasm of sigmoid colon: Principal | ICD-10-CM

## 2017-01-11 DIAGNOSIS — K6389 Other specified diseases of intestine: Secondary | ICD-10-CM

## 2017-01-11 DIAGNOSIS — C186 Malignant neoplasm of descending colon: Secondary | ICD-10-CM

## 2017-01-11 DIAGNOSIS — R933 Abnormal findings on diagnostic imaging of other parts of digestive tract: Secondary | ICD-10-CM | POA: Diagnosis present

## 2017-01-11 DIAGNOSIS — C4492 Squamous cell carcinoma of skin, unspecified: Secondary | ICD-10-CM

## 2017-01-11 DIAGNOSIS — C800 Disseminated malignant neoplasm, unspecified: Secondary | ICD-10-CM | POA: Diagnosis present

## 2017-01-11 DIAGNOSIS — R1032 Left lower quadrant pain: Secondary | ICD-10-CM

## 2017-01-11 DIAGNOSIS — N5089 Other specified disorders of the male genital organs: Secondary | ICD-10-CM | POA: Diagnosis present

## 2017-01-11 DIAGNOSIS — C609 Malignant neoplasm of penis, unspecified: Secondary | ICD-10-CM | POA: Diagnosis present

## 2017-01-11 DIAGNOSIS — A09 Infectious gastroenteritis and colitis, unspecified: Secondary | ICD-10-CM | POA: Diagnosis present

## 2017-01-11 DIAGNOSIS — E871 Hypo-osmolality and hyponatremia: Secondary | ICD-10-CM

## 2017-01-11 DIAGNOSIS — D630 Anemia in neoplastic disease: Secondary | ICD-10-CM | POA: Diagnosis present

## 2017-01-11 DIAGNOSIS — R180 Malignant ascites: Secondary | ICD-10-CM | POA: Diagnosis present

## 2017-01-11 DIAGNOSIS — Z79891 Long term (current) use of opiate analgesic: Secondary | ICD-10-CM

## 2017-01-11 DIAGNOSIS — Z791 Long term (current) use of non-steroidal anti-inflammatories (NSAID): Secondary | ICD-10-CM

## 2017-01-11 DIAGNOSIS — K633 Ulcer of intestine: Secondary | ICD-10-CM | POA: Diagnosis present

## 2017-01-11 LAB — COMPREHENSIVE METABOLIC PANEL
ALBUMIN: 3.3 g/dL — AB (ref 3.5–5.0)
ALT: 18 U/L (ref 17–63)
AST: 26 U/L (ref 15–41)
Alkaline Phosphatase: 77 U/L (ref 38–126)
Anion gap: 11 (ref 5–15)
BUN: 12 mg/dL (ref 6–20)
CALCIUM: 8.7 mg/dL — AB (ref 8.9–10.3)
CO2: 22 mmol/L (ref 22–32)
CREATININE: 0.99 mg/dL (ref 0.61–1.24)
Chloride: 96 mmol/L — ABNORMAL LOW (ref 101–111)
GFR calc Af Amer: >60 mL/min (ref >60)
Glucose, Bld: 117 mg/dL — ABNORMAL HIGH (ref 65–99)
Potassium: 4 mmol/L (ref 3.5–5.1)
Sodium: 129 mmol/L — ABNORMAL LOW (ref 135–145)
TOTAL PROTEIN: 7 g/dL (ref 6.5–8.1)
Total Bilirubin: 0.5 mg/dL (ref 0.3–1.2)

## 2017-01-11 LAB — LIPASE, BLOOD: LIPASE: 23 U/L (ref 11–51)

## 2017-01-11 LAB — URINALYSIS, ROUTINE W REFLEX MICROSCOPIC
Bilirubin Urine: NEGATIVE
Glucose, UA: NEGATIVE mg/dL
Hgb urine dipstick: NEGATIVE
Ketones, ur: 5 mg/dL — AB
Leukocytes, UA: NEGATIVE
Nitrite: NEGATIVE
Protein, ur: NEGATIVE mg/dL
SPECIFIC GRAVITY, URINE: 1.017 (ref 1.005–1.030)
pH: 7 (ref 5.0–8.0)

## 2017-01-11 LAB — CBC
HCT: 39.2 % (ref 39.0–52.0)
Hemoglobin: 14 g/dL (ref 13.0–17.0)
MCH: 29.4 pg (ref 26.0–34.0)
MCHC: 35.7 g/dL (ref 30.0–36.0)
MCV: 82.2 fL (ref 78.0–100.0)
PLATELETS: 382 10*3/uL (ref 150–400)
RBC: 4.77 MIL/uL (ref 4.22–5.81)
RDW: 12.8 % (ref 11.5–15.5)
WBC: 10.2 10*3/uL (ref 4.0–10.5)

## 2017-01-11 MED ORDER — IOPAMIDOL (ISOVUE-300) INJECTION 61%
INTRAVENOUS | Status: AC
  Filled 2017-01-11: qty 100

## 2017-01-11 MED ORDER — IOPAMIDOL (ISOVUE-300) INJECTION 61%
100.0000 mL | Freq: Once | INTRAVENOUS | Status: AC | PRN
  Administered 2017-01-11: 100 mL via INTRAVENOUS

## 2017-01-11 MED ORDER — OXYCODONE HCL 5 MG PO TABS
5.0000 mg | ORAL_TABLET | ORAL | Status: DC | PRN
  Administered 2017-01-12 – 2017-01-17 (×9): 5 mg via ORAL
  Filled 2017-01-11 (×9): qty 1

## 2017-01-11 MED ORDER — SODIUM CHLORIDE 0.9 % IV BOLUS (SEPSIS)
1000.0000 mL | Freq: Once | INTRAVENOUS | Status: AC
  Administered 2017-01-11: 1000 mL via INTRAVENOUS

## 2017-01-11 MED ORDER — AMLODIPINE BESYLATE 5 MG PO TABS
2.5000 mg | ORAL_TABLET | Freq: Every evening | ORAL | Status: DC
  Administered 2017-01-12: 2.5 mg via ORAL
  Filled 2017-01-11: qty 1

## 2017-01-11 MED ORDER — DEXTROSE-NACL 5-0.9 % IV SOLN
INTRAVENOUS | Status: DC
  Administered 2017-01-11 – 2017-01-15 (×8): via INTRAVENOUS

## 2017-01-11 MED ORDER — ONDANSETRON HCL 4 MG/2ML IJ SOLN: 4.0000 mg | Freq: Four times a day (QID) | INTRAMUSCULAR | Status: DC | PRN

## 2017-01-11 MED ORDER — ONDANSETRON HCL 4 MG PO TABS: 4.0000 mg | ORAL_TABLET | Freq: Four times a day (QID) | ORAL | Status: DC | PRN

## 2017-01-11 MED ORDER — ACETAMINOPHEN 650 MG RE SUPP: 650.0000 mg | Freq: Four times a day (QID) | RECTAL | Status: DC | PRN

## 2017-01-11 MED ORDER — BENAZEPRIL HCL 10 MG PO TABS
10.0000 mg | ORAL_TABLET | Freq: Every evening | ORAL | Status: DC
  Administered 2017-01-12 – 2017-01-17 (×6): 10 mg via ORAL
  Filled 2017-01-11 (×6): qty 1

## 2017-01-11 MED ORDER — ACETAMINOPHEN 325 MG PO TABS: 650.0000 mg | ORAL_TABLET | Freq: Four times a day (QID) | ORAL | Status: DC | PRN

## 2017-01-11 NOTE — ED Provider Notes (Signed)
[facility] DEPT Provider Note   CSN: [id number] Arrival date & time: 01/11/17  1225     History   Chief Complaint Chief Complaint  Patient presents with  . Abdominal Pain    HPI Thomas Wagner is a 76 y.o. male.  HPI   76 year old male presents today with complaints of nausea vomiting diarrhea and abdominal pain.  Patient reports a 6-week history of feeling vague illness with minor left lower quadrant abdominal pain.  He notes over the last several weeks symptoms have worsened, and the last 2 days have been unbearable.  He notes he has had several episodes of vomiting most recently 2 days ago.  He notes every time he tries to eat he feels as if he is going to vomit and has not been able to tolerate much p.o.  Patient reports that he is also having very loose stools.  He has been seen by his primary care provider who diagnosed him with diverticulitis and started him on 2 unknown antibiotics.  Patient notes that he took a dose 3 days ago, but has been unable to tolerate the medications over the last several days.  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  She denies any history of abdominal surgeries.  He denies any fever or weight loss.  Pt reports no previous colonoscopy.    Past Medical History:  Diagnosis Date  . History of traumatic subdural hematoma    08-05-2005  --- pt fell off roof--- per imaging right posteroparietal-right parafalcine subdural hematoma w/ concussion --- per pt no residual  . Hypertension   . Nocturia   . Penile mass   . Phimosis   . Pre-diabetes   . Wears dentures    upper    There are no active problems to display for this patient.   Past Surgical History:  Procedure Laterality Date  . NO PAST SURGERIES         Home Medications    Prior to Admission medications   Medication Sig Start Date End Date Taking? Authorizing Provider  amLODipine (NORVASC)  5 MG tablet Take 2.5 mg by mouth every evening.   Yes [provider]  benazepril (LOTENSIN) 20 MG tablet Take 10 mg by mouth every evening.   Yes [provider]  cephALEXin (KEFLEX) 500 MG capsule Take 500 mg 4 (four) times daily by mouth.   Yes [provider]  CINNAMON PO Take by mouth.   Yes [provider]  ciprofloxacin (CIPRO) 500 MG tablet Take 500 mg 2 (two) times daily by mouth. 01/06/17  Yes [provider]  GARLIC PO Take by mouth.   Yes [provider]  ibuprofen (ADVIL,MOTRIN) 200 MG tablet Take 400 mg 2 (two) times daily as needed by mouth.   Yes [provider]  metroNIDAZOLE (FLAGYL) 500 MG tablet Take 500 mg 3 (three) times daily by mouth. 11/15/16  Yes [provider]  oxycodone (OXY-IR) 5 MG capsule Take 1-2 capsules (5-10 mg total) every 4 (four) hours as needed by mouth. 01/03/17   Alla Feeling, MD    Family History History reviewed. No pertinent family history.  Social History Social History   Tobacco Use  . Smoking status: Former Smoker    Years: 5.00    Types: Cigarettes    Last attempt to quit: 12/24/1973    Years since quitting: 43.0  . Smokeless tobacco: Former Systems developer  Types: Sarina Ser    Quit date: 12/25/1978  Substance Use Topics  . Alcohol use: No  . Drug use: No     Allergies   Patient has no known allergies.   Review of Systems Review of Systems  All other systems reviewed and are negative.    Physical Exam Updated Vital Signs BP 130/84 (BP Location: Left Arm)   Pulse 98   Temp 97.8 F (36.6 C) (Oral)   Resp 18   Ht 6' (1.829 m)   Wt 81.6 kg (180 lb)   SpO2 97%   BMI 24.41 kg/m   Physical Exam  Constitutional: He is oriented to person, place, and time. He appears well-developed and well-nourished.  HENT:  Head: Normocephalic and atraumatic.  Eyes: Conjunctivae are normal. Pupils are equal, round, and reactive to light. Right eye exhibits no discharge. Left  eye exhibits no discharge. No scleral icterus.  Neck: Normal range of motion. No JVD present. No tracheal deviation present.  Pulmonary/Chest: Effort normal. No stridor.  Abdominal: Bowel sounds are normal. He exhibits distension. There is tenderness in the left lower quadrant.  Neurological: He is alert and oriented to person, place, and time. Coordination normal.  Psychiatric: He has a normal mood and affect. His behavior is normal. Judgment and thought content normal.  Nursing note and vitals reviewed.    ED Treatments / Results  Labs (all labs ordered are listed, but only abnormal results are displayed) Labs Reviewed  COMPREHENSIVE METABOLIC PANEL - Abnormal; Notable for the following components:      Result Value   Sodium 129 (*)    Chloride 96 (*)    Glucose, Bld 117 (*)    Calcium 8.7 (*)    Albumin 3.3 (*)    All other components within normal limits  URINALYSIS, ROUTINE W REFLEX MICROSCOPIC - Abnormal; Notable for the following components:   Color, Urine STRAW (*)    Ketones, ur 5 (*)    All other components within normal limits  LIPASE, BLOOD  CBC    EKG  EKG Interpretation None       Radiology Ct Abdomen Pelvis W Contrast  Result Date: 01/11/2017 CLINICAL DATA:  Abdominal pain, cramping, bloating and diarrhea for 1 month. EXAM: CT ABDOMEN AND PELVIS WITH CONTRAST TECHNIQUE: Multidetector CT imaging of the abdomen and pelvis was performed using the standard protocol following bolus administration of intravenous contrast. CONTRAST:  191mL ISOVUE-300 IOPAMIDOL (ISOVUE-300) INJECTION 61% COMPARISON:  None. FINDINGS: Lower chest: Small dependent left pleural effusion. Mild dependent left lower lobe atelectasis. Coronary atherosclerosis. Hepatobiliary: Normal liver with no liver mass. Normal gallbladder with no radiopaque cholelithiasis. No biliary ductal dilatation. Pancreas: Normal, with no mass or duct dilation. Spleen: Normal size. No mass. Adrenals/Urinary Tract: No  discrete adrenal nodules. No hydronephrosis. Small simple parapelvic renal cysts in both kidneys. No suspicious renal masses. Normal bladder. Stomach/Bowel: Small hiatal hernia. Otherwise normal nondistended stomach. Normal caliber small bowel with no small bowel wall thickening. Normal appendix. There is an irregular annular mass in the sigmoid colon measuring approximately 6.2 x 3.6 cm (series 2/ image 79). There is moderate left colonic diverticulosis. No significant large bowel dilatation. No additional sites of large bowel wall thickening. Vascular/Lymphatic: Atherosclerotic nonaneurysmal abdominal aorta. Patent portal, splenic, hepatic and renal veins. There are several clustered mildly enlarged lymph nodes in the sigmoid mesentery measuring up to 1.4 cm (series 2/ image 75). Reproductive: Top-normal size prostate. Other: There is extensive soft tissue caking throughout the omentum. Moderate volume  ascites. Smooth peritoneal thickening and enhancement throughout the peritoneal cavity. Musculoskeletal: No aggressive appearing focal osseous lesions. Marked thoracolumbar spondylosis. IMPRESSION: 1. Irregular annular 6.2 x 3.6 cm mass in the sigmoid colon suspicious for primary colonic malignancy. 2. Multiple mildly enlarged lymph nodes in the sigmoid mesenteries suspicious for locoregional nodal metastases. 3. Extensive soft tissue caking throughout the omentum. Smooth peritoneal thickening and enhancement throughout the peritoneal cavity. Moderate volume ascites. 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 4. Small dependent left pleural effusion. 5.  Aortic Atherosclerosis (ICD10-I70.0).  Coronary atherosclerosis. Electronically Signed   By: Ilona Sorrel M.D.   On: 01/11/2017 16:31    Procedures Procedures (including critical care time)  Medications Ordered in ED Medications  iopamidol (ISOVUE-300) 61 % injection (not administered)  sodium chloride 0.9 % bolus 1,000 mL (0 mLs  Intravenous Stopped 01/11/17 1533)  iopamidol (ISOVUE-300) 61 % injection 100 mL (100 mLs Intravenous Contrast Given 01/11/17 1541)     Initial Impression / Assessment and Plan / ED Course  I have reviewed the triage vital signs and the nursing notes.  Pertinent labs & imaging results that were available during my care of the patient were reviewed by me and considered in my medical decision making (see chart for details).      Final Clinical Impressions(s) / ED Diagnoses   Final diagnoses:  Left lower quadrant pain  Malignant neoplasm of sigmoid colon (HCC)     Labs:  CBC, CMP, lipase, UA  Imaging:   Consults: Dr. Havery Moros [facility], [facility]   Therapeutics:  Discharge Meds:   Assessment/Plan: 76 year old male presents today with left lower quadrant abdominal pain, nausea vomiting and diarrhea.  Patient is afebrile at this time but he is slightly tachycardic.  Basic labs CT scan will be ordered with concern for diverticulitis.  No signs of obstruction on exam.  Patient's imaging returned showing likely cancerous mass in the sigmoid colon with metastatic disease.  Case was discussed with on-call gastroenterologist Dr. Havery Moros.  Recommend patient be brought into the hospital given inability to tolerate p.o., gastroenterology will see the patient in the morning.  Patient will likely be unable to tolerate bowel prep, n.p.o. after midnight.  I discussed this with the patient and family, they agreed for hospital admission for ongoing evaluation and management.  Patient is resting comfortably at this time.  Hospitalist service consulted who agreed for hospital admission.      ED Discharge Orders    None       Okey Regal, PA-C 01/11/17 1743    Drenda Freeze, MD 01/12/17 [phone]

## 2017-01-11 NOTE — H&P (Signed)
History and Physical    [name]:[id number] DOB: Apr 03, 1940 DOA: 01/11/2017  PCP: [name], MD  Patient coming from: home  I have personally briefly reviewed patient's old medical records in [place]  Chief Complaint: Nausea, vomiting, and abdominal pain  HPI: Thomas Wagner is Thomas Wagner 76 y.o. male with medical history significant of hypertension, squamous cell carcinoma presenting with decreased appetite, nausea, abdominal pain.  Patient notes that his symptoms started 2-3 weeks ago.  He notes that he noticed decreased appetite at that period of time.  He is also noticed left lower quadrant abdominal pain.  He describes as being constant, achy.  He notes that he can wear [name] belt anymore because it bothers him.  Having his hands loose feels better.  He denies any fevers, chills, cough, cold, chest pain, shortness of breath.  He does note loose stools recently.  He has never had [name] colonoscopy.  He recently had Issabelle Mcraney biopsy of his foreskin with urology that was positive for squamous cell carcinoma.  ED Course: In the emergency department the patient had labs, imaging concerning for colon cancer with metastatic disease.  Plan was to admit given inability for patient to tolerate p.o. likely scope in the morning.  Review of Systems: As per HPI otherwise 10 point review of systems negative.   Past Medical History:  Diagnosis Date  . History of traumatic subdural hematoma    08-05-2005  --- pt fell off roof--- per imaging right posteroparietal-right parafalcine subdural hematoma w/ concussion --- per pt no residual  . Hypertension   . Nocturia   . Penile mass   . Phimosis   . Pre-diabetes   . Wears dentures    upper    Past Surgical History:  Procedure Laterality Date  . NO PAST SURGERIES       reports that he quit smoking about 43 years ago. His smoking use included cigarettes. He quit after 5.00 years of use. He quit smokeless tobacco use about 38 years ago. His smokeless  tobacco use included chew. He reports that he does not drink alcohol or use drugs.  No Known Allergies  History reviewed. No pertinent family history.  Prior to Admission medications   Medication Sig Start Date End Date Taking? Authorizing Provider  amLODipine (NORVASC) 5 MG tablet Take 2.5 mg by mouth every evening.   Yes [provider]  benazepril (LOTENSIN) 20 MG tablet Take 10 mg by mouth every evening.   Yes [provider]  cephALEXin (KEFLEX) 500 MG capsule Take 500 mg 4 (four) times daily by mouth.   Yes [provider]  CINNAMON PO Take by mouth.   Yes [provider]  ciprofloxacin (CIPRO) 500 MG tablet Take 500 mg 2 (two) times daily by mouth. 01/06/17  Yes [provider]  GARLIC PO Take by mouth.   Yes [provider]  ibuprofen (ADVIL,MOTRIN) 200 MG tablet Take 400 mg 2 (two) times daily as needed by mouth.   Yes [provider]  metroNIDAZOLE (FLAGYL) 500 MG tablet Take 500 mg 3 (three) times daily by mouth. 11/15/16  Yes [provider]  oxycodone (OXY-IR) 5 MG capsule Take 1-2 capsules (5-10 mg total) every 4 (four) hours as needed by mouth. 01/03/17   Alla Feeling, MD    Physical Exam: Vitals:   01/11/17 1237 01/11/17 1241 01/11/17 1409  BP: (!) 164/103  130/84  Pulse: (!) 116  98  Resp: 18    Temp:  97.8 F (36.6 C)    TempSrc: Oral    SpO2: 97%    Weight:  81.6 kg (180 lb)   Height:  6' (1.829 m)     Constitutional: NAD, calm, comfortable Vitals:   01/11/17 1237 01/11/17 1241 01/11/17 1409  BP: (!) 164/103  130/84  Pulse: (!) 116  98  Resp: 18    Temp: 97.8 F (36.6 C)    TempSrc: Oral    SpO2: 97%    Weight:  81.6 kg (180 lb)   Height:  6' (1.829 m)    Eyes: PERRL, lids and conjunctivae normal ENMT: Mucous membranes are moist. Posterior pharynx clear of any exudate or lesions.Normal dentition.  Neck: normal, supple, no masses, no thyromegaly Respiratory: clear to auscultation  bilaterally, no wheezing, no crackles. Normal respiratory effort. No accessory muscle use.  Cardiovascular: Regular rate and rhythm, no murmurs / rubs / gallops. No extremity edema. 2+ pedal pulses. No carotid bruits.  Abdomen: mild LLQ tenderness, no masses palpated. No hepatosplenomegaly. Bowel sounds positive.  Musculoskeletal: no clubbing / cyanosis. No joint deformity upper and lower extremities. Good ROM, no contractures. Normal muscle tone.  Skin: no rashes, lesions, ulcers. No induration Neurologic: CN 2-12 grossly intact. Sensation intact, DTR normal. Strength 5/5 in all 4.  Psychiatric: Normal judgment and insight. Alert and oriented x 3. Normal mood.   Labs on Admission: I have personally reviewed following labs and imaging studies  CBC: Recent Labs  Lab 01/11/17 1359  WBC 10.2  HGB 14.0  HCT 39.2  MCV 82.2  PLT 272   Basic Metabolic Panel: Recent Labs  Lab 01/11/17 1359  NA 129*  K 4.0  CL 96*  CO2 22  GLUCOSE 117*  BUN 12  CREATININE 0.99  CALCIUM 8.7*   GFR: Estimated Creatinine Clearance: 69.7 mL/min (by C-G formula based on SCr of 0.99 mg/dL). Liver Function Tests: Recent Labs  Lab 01/11/17 1359  AST 26  ALT 18  ALKPHOS 77  BILITOT 0.5  PROT 7.0  ALBUMIN 3.3*   Recent Labs  Lab 01/11/17 1359  LIPASE 23   No results for input(s): AMMONIA in the last 168 hours. Coagulation Profile: No results for input(s): INR, PROTIME in the last 168 hours. Cardiac Enzymes: No results for input(s): CKTOTAL, CKMB, CKMBINDEX, TROPONINI in the last 168 hours. BNP (last 3 results) No results for input(s): PROBNP in the last 8760 hours. HbA1C: No results for input(s): HGBA1C in the last 72 hours. CBG: No results for input(s): GLUCAP in the last 168 hours. Lipid Profile: No results for input(s): CHOL, HDL, LDLCALC, TRIG, CHOLHDL, LDLDIRECT in the last 72 hours. Thyroid Function Tests: No results for input(s): TSH, T4TOTAL, FREET4, T3FREE, THYROIDAB in the last  72 hours. Anemia Panel: No results for input(s): VITAMINB12, FOLATE, FERRITIN, TIBC, IRON, RETICCTPCT in the last 72 hours. Urine analysis:    Component Value Date/Time   COLORURINE STRAW (Lowry Bala) 01/11/2017 1600   APPEARANCEUR CLEAR 01/11/2017 1600   LABSPEC 1.017 01/11/2017 1600   PHURINE 7.0 01/11/2017 1600   GLUCOSEU NEGATIVE 01/11/2017 1600   HGBUR NEGATIVE 01/11/2017 1600   BILIRUBINUR NEGATIVE 01/11/2017 1600   KETONESUR 5 (Carlitos Bottino) 01/11/2017 1600   PROTEINUR NEGATIVE 01/11/2017 1600   NITRITE NEGATIVE 01/11/2017 1600   LEUKOCYTESUR NEGATIVE 01/11/2017 1600    Radiological Exams on Admission: Ct Abdomen Pelvis W Contrast  Result Date: 01/11/2017 CLINICAL DATA:  Abdominal pain, cramping, bloating and diarrhea for 1 month. EXAM: CT ABDOMEN AND PELVIS WITH CONTRAST TECHNIQUE:  Multidetector CT imaging of the abdomen and pelvis was performed using the standard protocol following bolus administration of intravenous contrast. CONTRAST:  189mL ISOVUE-300 IOPAMIDOL (ISOVUE-300) INJECTION 61% COMPARISON:  None. FINDINGS: Lower chest: Small dependent left pleural effusion. Mild dependent left lower lobe atelectasis. Coronary atherosclerosis. Hepatobiliary: Normal liver with no liver mass. Normal gallbladder with no radiopaque cholelithiasis. No biliary ductal dilatation. Pancreas: Normal, with no mass or duct dilation. Spleen: Normal size. No mass. Adrenals/Urinary Tract: No discrete adrenal nodules. No hydronephrosis. Small simple parapelvic renal cysts in both kidneys. No suspicious renal masses. Normal bladder. Stomach/Bowel: Small hiatal hernia. Otherwise normal nondistended stomach. Normal caliber small bowel with no small bowel wall thickening. Normal appendix. There is an irregular annular mass in the sigmoid colon measuring approximately 6.2 x 3.6 cm (series 2/ image 79). There is moderate left colonic diverticulosis. No significant large bowel dilatation. No additional sites of large bowel wall  thickening. Vascular/Lymphatic: Atherosclerotic nonaneurysmal abdominal aorta. Patent portal, splenic, hepatic and renal veins. There are several clustered mildly enlarged lymph nodes in the sigmoid mesentery measuring up to 1.4 cm (series 2/ image 75). Reproductive: Top-normal size prostate. Other: There is extensive soft tissue caking throughout the omentum. Moderate volume ascites. Smooth peritoneal thickening and enhancement throughout the peritoneal cavity. Musculoskeletal: No aggressive appearing focal osseous lesions. Marked thoracolumbar spondylosis. IMPRESSION: 1. Irregular annular 6.2 x 3.6 cm mass in the sigmoid colon suspicious for primary colonic malignancy. 2. Multiple mildly enlarged lymph nodes in the sigmoid mesenteries suspicious for locoregional nodal metastases. 3. Extensive soft tissue caking throughout the omentum. Smooth peritoneal thickening and enhancement throughout the peritoneal cavity. Moderate volume ascites. 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 4. Small dependent left pleural effusion. 5.  Aortic Atherosclerosis (ICD10-I70.0).  Coronary atherosclerosis. Electronically Signed   By: Ilona Sorrel M.D.   On: 01/11/2017 16:31    EKG: Independently reviewed. 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  Likely sigmoidoscopy tomorrow with biopsy. Oncology c/s (not called tonight, will need to touch base tomorrow) Antiemetics, pain control  Nausea, vomiting  Decreased PO:  Nutrition c/s IVF and antiemetics as above ADAT after procedure  Hyponatremia: Likely hypovolemic given above.  F/u repeat labs.  HTN:  Home  amlodpine and benazepril  Squamous Cell Carcinoma:  Recently dx with biopsy last week F/u outpatient   DVT prophylaxis: SCD's given procedure tomorrow  Code Status: full code  Family Communication: wife, daughters at bedside  Disposition Plan: pending biopsy and ability to tolerate PO  Consults called: GI  Admission status: obs    Fayrene Helper MD [facility] Pager [phone]  If 7PM-7AM, please contact night-coverage [email or web address] Password [facility]  01/11/2017, 6:55 PM

## 2017-01-11 NOTE — ED Triage Notes (Signed)
Patient presents with abdominal pain, cramping, bloating, and diarrhea for 1 month. Patient reports seeing his PCP on Thursday for the issue and was prescribed antibiotics, but patient is unsure of what his diagnosis was. Patient reports "My appetite is gone, and I cant force myself to eat or Ill throw up." Patient states he has a history of abdominal pain, but he states "this is worse than Ive ever felt."  Patient also reports he had a circumcision for a cancerous spot on 01/03/17.

## 2017-01-12 ENCOUNTER — Encounter (HOSPITAL_BASED_OUTPATIENT_CLINIC_OR_DEPARTMENT_OTHER): Payer: Self-pay | Admitting: Urology

## 2017-01-12 DIAGNOSIS — R180 Malignant ascites: Secondary | ICD-10-CM

## 2017-01-12 DIAGNOSIS — Z95828 Presence of other vascular implants and grafts: Secondary | ICD-10-CM | POA: Diagnosis not present

## 2017-01-12 DIAGNOSIS — K633 Ulcer of intestine: Secondary | ICD-10-CM | POA: Diagnosis present

## 2017-01-12 DIAGNOSIS — A09 Infectious gastroenteritis and colitis, unspecified: Secondary | ICD-10-CM | POA: Diagnosis present

## 2017-01-12 DIAGNOSIS — D490 Neoplasm of unspecified behavior of digestive system: Secondary | ICD-10-CM | POA: Diagnosis not present

## 2017-01-12 DIAGNOSIS — R195 Other fecal abnormalities: Secondary | ICD-10-CM

## 2017-01-12 DIAGNOSIS — C800 Disseminated malignant neoplasm, unspecified: Secondary | ICD-10-CM | POA: Diagnosis present

## 2017-01-12 DIAGNOSIS — C4492 Squamous cell carcinoma of skin, unspecified: Secondary | ICD-10-CM | POA: Diagnosis not present

## 2017-01-12 DIAGNOSIS — Z79891 Long term (current) use of opiate analgesic: Secondary | ICD-10-CM | POA: Diagnosis not present

## 2017-01-12 DIAGNOSIS — R1032 Left lower quadrant pain: Secondary | ICD-10-CM | POA: Diagnosis not present

## 2017-01-12 DIAGNOSIS — R109 Unspecified abdominal pain: Secondary | ICD-10-CM | POA: Diagnosis not present

## 2017-01-12 DIAGNOSIS — C187 Malignant neoplasm of sigmoid colon: Secondary | ICD-10-CM | POA: Diagnosis present

## 2017-01-12 DIAGNOSIS — I10 Essential (primary) hypertension: Secondary | ICD-10-CM | POA: Diagnosis present

## 2017-01-12 DIAGNOSIS — R933 Abnormal findings on diagnostic imaging of other parts of digestive tract: Secondary | ICD-10-CM | POA: Diagnosis not present

## 2017-01-12 DIAGNOSIS — R1111 Vomiting without nausea: Secondary | ICD-10-CM | POA: Diagnosis not present

## 2017-01-12 DIAGNOSIS — Z800 Family history of malignant neoplasm of digestive organs: Secondary | ICD-10-CM | POA: Diagnosis not present

## 2017-01-12 DIAGNOSIS — D074 Carcinoma in situ of penis: Secondary | ICD-10-CM | POA: Diagnosis not present

## 2017-01-12 DIAGNOSIS — R7303 Prediabetes: Secondary | ICD-10-CM | POA: Diagnosis present

## 2017-01-12 DIAGNOSIS — Z791 Long term (current) use of non-steroidal anti-inflammatories (NSAID): Secondary | ICD-10-CM | POA: Diagnosis not present

## 2017-01-12 DIAGNOSIS — D72829 Elevated white blood cell count, unspecified: Secondary | ICD-10-CM | POA: Diagnosis present

## 2017-01-12 DIAGNOSIS — E871 Hypo-osmolality and hyponatremia: Secondary | ICD-10-CM | POA: Diagnosis present

## 2017-01-12 DIAGNOSIS — C799 Secondary malignant neoplasm of unspecified site: Secondary | ICD-10-CM

## 2017-01-12 DIAGNOSIS — R188 Other ascites: Secondary | ICD-10-CM | POA: Diagnosis not present

## 2017-01-12 DIAGNOSIS — E861 Hypovolemia: Secondary | ICD-10-CM | POA: Diagnosis present

## 2017-01-12 DIAGNOSIS — Z87891 Personal history of nicotine dependence: Secondary | ICD-10-CM | POA: Diagnosis not present

## 2017-01-12 DIAGNOSIS — C609 Malignant neoplasm of penis, unspecified: Secondary | ICD-10-CM | POA: Diagnosis present

## 2017-01-12 DIAGNOSIS — N5089 Other specified disorders of the male genital organs: Secondary | ICD-10-CM | POA: Diagnosis present

## 2017-01-12 DIAGNOSIS — R140 Abdominal distension (gaseous): Secondary | ICD-10-CM | POA: Diagnosis not present

## 2017-01-12 DIAGNOSIS — Z79899 Other long term (current) drug therapy: Secondary | ICD-10-CM | POA: Diagnosis not present

## 2017-01-12 DIAGNOSIS — D630 Anemia in neoplastic disease: Secondary | ICD-10-CM | POA: Diagnosis present

## 2017-01-12 DIAGNOSIS — R112 Nausea with vomiting, unspecified: Secondary | ICD-10-CM | POA: Diagnosis not present

## 2017-01-12 DIAGNOSIS — C801 Malignant (primary) neoplasm, unspecified: Secondary | ICD-10-CM | POA: Diagnosis not present

## 2017-01-12 DIAGNOSIS — K56609 Unspecified intestinal obstruction, unspecified as to partial versus complete obstruction: Secondary | ICD-10-CM | POA: Diagnosis not present

## 2017-01-12 LAB — CBC
HCT: 37.2 % — ABNORMAL LOW (ref 39.0–52.0)
Hemoglobin: 12.8 g/dL — ABNORMAL LOW (ref 13.0–17.0)
MCH: 28.6 pg (ref 26.0–34.0)
MCHC: 34.4 g/dL (ref 30.0–36.0)
MCV: 83 fL (ref 78.0–100.0)
PLATELETS: 359 10*3/uL (ref 150–400)
RBC: 4.48 MIL/uL (ref 4.22–5.81)
RDW: 13 % (ref 11.5–15.5)
WBC: 6.9 10*3/uL (ref 4.0–10.5)

## 2017-01-12 LAB — COMPREHENSIVE METABOLIC PANEL
ALK PHOS: 67 U/L (ref 38–126)
ALT: 15 U/L — ABNORMAL LOW (ref 17–63)
AST: 20 U/L (ref 15–41)
Albumin: 3.1 g/dL — ABNORMAL LOW (ref 3.5–5.0)
Anion gap: 7 (ref 5–15)
BUN: 10 mg/dL (ref 6–20)
CALCIUM: 8.5 mg/dL — AB (ref 8.9–10.3)
CHLORIDE: 105 mmol/L (ref 101–111)
CO2: 23 mmol/L (ref 22–32)
Creatinine, Ser: 0.87 mg/dL (ref 0.61–1.24)
Glucose, Bld: 119 mg/dL — ABNORMAL HIGH (ref 65–99)
Potassium: 3.9 mmol/L (ref 3.5–5.1)
SODIUM: 135 mmol/L (ref 135–145)
Total Bilirubin: 0.6 mg/dL (ref 0.3–1.2)
Total Protein: 6.2 g/dL — ABNORMAL LOW (ref 6.5–8.1)

## 2017-01-12 MED ORDER — AMLODIPINE BESYLATE 5 MG PO TABS
5.0000 mg | ORAL_TABLET | Freq: Every evening | ORAL | Status: DC
  Administered 2017-01-13 – 2017-01-16 (×4): 5 mg via ORAL
  Administered 2017-01-17: 2.5 mg via ORAL
  Filled 2017-01-12 (×5): qty 1

## 2017-01-12 NOTE — Consult Note (Signed)
[facility] Consult: 10:26 AM 01/12/2017  LOS: 0 days    Referring Provider: Dr Sherral Hammers  Primary Care Physician:  Thomas Housekeeper, MD Primary Gastroenterologist:  unassigned     Reason for Consultation:  Sigmoid mass with obstructive GI sxs.     HPI: Thomas Wagner is a 76 y.o. male. intil recently, no previous serious health problems.  HTN.   S/P 01/03/17 circumcision for history of phimosis and palpable penile lesion.  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  Plan is for referral to [facility], per pt and family. Patient has never undergone colonoscopy or EGD.  He has declined PMD suggestions for colonoscopy in the past.    3 weeks of progressive early satiety.  In the last 3-4 days it has gotten to the point where he can hardly eat anything but he is able to tolerate coffee and sometimes water.  He is adapted to the situation so that he rarely is nauseated and has vomited only once.  When he eats he gets pain in his lower abdomen bilaterally.  He has had loose "runny" brown, nonbloody stools for about 6 weeks.  Generally has soft to loose stools once, at most twice daily.  In the last 6 weeks stool frequency has increased to about 3 times daily.  Abdomen has become more distended in the last week or so. Last week, PMD RXd Cipro/Flagyl for ? Infectious diarrhea, but pt unable to tolerate  He denies weight loss and dysphagia.  Rarely uses ibuprofen, took a couple of doses of this last week after his penile surgery.  Received oxycodone since he has been admitted and it does help the abdominal pain.  No dysuria or hematuria. Labs are mostly unremarkable.  He is Hgb 01/03/17 16.7, 14 on 11/13, 12.8 today.  Glucose slightly elevated in the 1teens.  UA unrevealing.    CT scan abdomen and pelvis with contrast  reveals sigmoid colon mass suspicious for malignancy with adjacent adenopathy suspicious for nodal metastasis.  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  Family history pertinent for pancreatic cancer which was the cause of his mother's death in her early 36s.  He has a daughter and a son who has had history of adenomatous colon polyps.  Past Medical History:  Diagnosis Date  . History of traumatic subdural hematoma    08-05-2005  --- pt fell off roof--- per imaging right posteroparietal-right parafalcine subdural hematoma w/ concussion --- per pt no residual  . Hypertension   . Nocturia   . Penile mass   . Phimosis   . Pre-diabetes   . Wears dentures    upper    Past Surgical History:  Procedure Laterality Date  . NO PAST SURGERIES      Prior to Admission medications   Medication Sig Start Date End Date Taking? Authorizing Provider  amLODipine (NORVASC) 5 MG tablet Take 2.5 mg by mouth every evening.   Yes [provider]  benazepril (LOTENSIN) 20 MG tablet Take 10 mg by  mouth every evening.   Yes [provider]  cephALEXin (KEFLEX) 500 MG capsule Take 500 mg 4 (four) times daily by mouth.   Yes [provider]  CINNAMON PO Take by mouth.   Yes [provider]  ciprofloxacin (CIPRO) 500 MG tablet Take 500 mg 2 (two) times daily by mouth. 01/06/17  Yes [provider]  GARLIC PO Take by mouth.   Yes [provider]  ibuprofen (ADVIL,MOTRIN) 200 MG tablet Take 400 mg 2 (two) times daily as needed by mouth.   Yes [provider]  metroNIDAZOLE (FLAGYL) 500 MG tablet Take 500 mg 3 (three) times daily by mouth. 11/15/16  Yes [provider]  oxycodone (OXY-IR) 5 MG capsule Take 1-2 capsules (5-10 mg total) every 4 (four) hours as needed by mouth. 01/03/17   Alla Feeling, MD    Scheduled Meds: . amLODipine  2.5 mg Oral QPM  .  benazepril  10 mg Oral QPM   Infusions: . dextrose 5 % and 0.9% NaCl 100 mL/hr at 01/12/17 0725   PRN Meds: acetaminophen **OR** acetaminophen, ondansetron **OR** ondansetron (ZOFRAN) IV, oxyCODONE   Allergies as of 01/11/2017  . (No Known Allergies)    Family History  Problem Relation Age of Onset  . Pancreatic cancer Mother   . Other Daughter        Precancerous lesions on colonoscopy  . Other Son        Precancerous lesion on colonoscopy    Social History   Socioeconomic History  . Marital status: Married    Spouse name: Not on file  . Number of children: Not on file  . Years of education: Not on file  . Highest education level: Not on file  Social Needs  . Financial resource strain: Not on file  . Food insecurity - worry: Not on file  . Food insecurity - inability: Not on file  . Transportation needs - medical: Not on file  . Transportation needs - non-medical: Not on file  Occupational History  . Not on file  Tobacco Use  . Smoking status: Former Smoker    Years: 5.00    Types: Cigarettes    Last attempt to quit: 12/24/1973    Years since quitting: 43.0  . Smokeless tobacco: Former Systems developer    Types: [place] date: 12/25/1978  Substance and Sexual Activity  . Alcohol use: No  . Drug use: No  . Sexual activity: Not on file  Other Topics Concern  . Not on file  Social History Narrative  . Not on file    REVIEW OF SYSTEMS: Constitutional: Fatigue and weakness in recent days with lack of p.o. intake. ENT:  No nose bleeds Pulm: No shortness of breath or cough. CV:  No palpitations, no LE edema.  No chest pain GU:  No hematuria, no frequency.  Surgical site on the penis is healing and not causing significant pain. GI:  Per HPI Heme: No excessive bleeding.  Develops purpura on his hands and arms easily. Transfusions: None ever. Neuro:  No headaches, no peripheral tingling or numbness.  No seizures.  No syncope. Derm:  No itching, no rash or sores.    Endocrine:  No sweats or chills.  No polyuria or dysuria Immunization:  Did not inquire as to recent vaccines Travel:  None beyond local counties in last few months.    PHYSICAL EXAM: Vital signs in last 24 hours: Vitals:   01/11/17 1959 01/12/17 0400  BP:  (!) 140/91  Pulse: 94 79  Resp: 18 20  Temp: 98.3 F (36.8 C) 98.5 F (36.9 C)  SpO2: 97% 95%   Wt Readings from Last 3 Encounters:  01/11/17 84 kg (185 lb 3 oz)  01/03/17 85.7 kg (189 lb)    General: Pleasant, alert, well-appearing WM who is sitting up in bed comfortably.  Looks younger than stated age Head: No facial asymmetry or swelling.  No signs of head trauma. Eyes: No scleral icterus, no conjunctival pallor.  EOMI. Ears: Not hard of hearing Nose: No congestion or discharge Mouth: Oral mucosa pink, moist, clear.  Tongue midline and coated with nonspecific white exudate.  Dentures in place.   Neck: No JVD, no masses, no thyromegaly. Lungs: Very fine rales at the left base.  No labored breathing.  No cough. Heart: RRR.  No MRG.  S1, S2 present. Abdomen: Soft.  Minor bilateral, lower abdominal tenderness.  No masses.  No HSM.  No tingling, tympanitic or general bowel sounds.  Abdomen is quiet.  Not distended. Rectal: Deferred rectal exam. GU: Shallow, erythematous ulcer on the right side of the glans Musc/Skeltl: No joint erythema, swelling or gross deformity. Extremities: No CCE. Neurologic: Alert.  Oriented x3.  No tremor, no limb weakness. Skin: No rashes or sores.  No significant bruising. Tattoos: None observed. Nodes: No inguinal or cervical adenopathy. Psych: Calm, cooperative.  Not anxious or depressed.  Intake/Output from previous day: 11/13 0701 - 11/14 0700 In: 590 [I.V.:590] Out: -  Intake/Output this shift: No intake/output data recorded.  LAB RESULTS: Recent Labs    01/11/17 1359 01/12/17 0404  WBC 10.2 6.9  HGB 14.0 12.8*  HCT 39.2 37.2*  PLT 382 359   BMET Lab Results  Component  Value Date   NA 135 01/12/2017   NA 129 (L) 01/11/2017   NA 133 (L) 01/03/2017   K 3.9 01/12/2017   K 4.0 01/11/2017   K 4.1 01/03/2017   CL 105 01/12/2017   CL 96 (L) 01/11/2017   CL 94 (L) 01/03/2017   CO2 23 01/12/2017   CO2 22 01/11/2017   GLUCOSE 119 (H) 01/12/2017   GLUCOSE 117 (H) 01/11/2017   GLUCOSE 109 (H) 01/03/2017   BUN 10 01/12/2017   BUN 12 01/11/2017   BUN 9 01/03/2017   CREATININE 0.87 01/12/2017   CREATININE 0.99 01/11/2017   CREATININE 1.00 01/03/2017   CALCIUM 8.5 (L) 01/12/2017   CALCIUM 8.7 (L) 01/11/2017   LFT Recent Labs    01/11/17 1359 01/12/17 0404  PROT 7.0 6.2*  ALBUMIN 3.3* 3.1*  AST 26 20  ALT 18 15*  ALKPHOS 77 67  BILITOT 0.5 0.6   PT/INR No results found for: INR, PROTIME Hepatitis Panel No results for input(s): HEPBSAG, HCVAB, HEPAIGM, HEPBIGM in the last 72 hours. C-Diff No components found for: CDIFF Lipase     Component Value Date/Time   LIPASE 23 01/11/2017 1359    Drugs of Abuse  No results found for: LABOPIA, COCAINSCRNUR, LABBENZ, AMPHETMU, THCU, LABBARB   RADIOLOGY STUDIES: Ct Abdomen Pelvis W Contrast  Result Date: 01/11/2017 CLINICAL DATA:  Abdominal pain, cramping, bloating and diarrhea for 1 month. EXAM: CT ABDOMEN AND PELVIS WITH CONTRAST TECHNIQUE: Multidetector CT imaging of the abdomen and pelvis was performed using the standard protocol following bolus administration of intravenous contrast. CONTRAST:  19mL ISOVUE-300 IOPAMIDOL (ISOVUE-300) INJECTION 61% COMPARISON:  None. FINDINGS: Lower chest: Small dependent left pleural effusion. Mild dependent left lower lobe atelectasis. Coronary  atherosclerosis. Hepatobiliary: Normal liver with no liver mass. Normal gallbladder with no radiopaque cholelithiasis. No biliary ductal dilatation. Pancreas: Normal, with no mass or duct dilation. Spleen: Normal size. No mass. Adrenals/Urinary Tract: No discrete adrenal nodules. No hydronephrosis. Small simple parapelvic renal  cysts in both kidneys. No suspicious renal masses. Normal bladder. Stomach/Bowel: Small hiatal hernia. Otherwise normal nondistended stomach. Normal caliber small bowel with no small bowel wall thickening. Normal appendix. There is an irregular annular mass in the sigmoid colon measuring approximately 6.2 x 3.6 cm (series 2/ image 79). There is moderate left colonic diverticulosis. No significant large bowel dilatation. No additional sites of large bowel wall thickening. Vascular/Lymphatic: Atherosclerotic nonaneurysmal abdominal aorta. Patent portal, splenic, hepatic and renal veins. There are several clustered mildly enlarged lymph nodes in the sigmoid mesentery measuring up to 1.4 cm (series 2/ image 75). Reproductive: Top-normal size prostate. Other: There is extensive soft tissue caking throughout the omentum. Moderate volume ascites. Smooth peritoneal thickening and enhancement throughout the peritoneal cavity. Musculoskeletal: No aggressive appearing focal osseous lesions. Marked thoracolumbar spondylosis. IMPRESSION: 1. Irregular annular 6.2 x 3.6 cm mass in the sigmoid colon suspicious for primary colonic malignancy. 2. Multiple mildly enlarged lymph nodes in the sigmoid mesenteries suspicious for locoregional nodal metastases. 3. Extensive soft tissue caking throughout the omentum. Smooth peritoneal thickening and enhancement throughout the peritoneal cavity. Moderate volume ascites. 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 4. Small dependent left pleural effusion. 5.  Aortic Atherosclerosis (ICD10-I70.0).  Coronary atherosclerosis. Electronically Signed   By: Ilona Sorrel M.D.   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   Margins of resected mass not tumor-free.  Squamous cell carcinoma could also be a source for the omental caking.   PLAN:     *  Plan flex sig tomorrow 9AM with enema-based bowel prep as he would not tolerate oral prep required for complete colonoscopy.      Azucena Freed  01/12/2017, 10:26 AM Pager: [phone]  GI ATTENDING  History, laboratories, x-rays reviewed. Patient personally seen and examined. Multiple family members in room. Agree with comprehensive consultation note as outlined above. Patient presents to the hospital with 6-8 week history of change in bowel habits (more difficulty) and abdominal distention. Intermittent vomiting with overeating. CT scan worrisome for sigmoid colon cancer with test assist to the peritoneal cavity with associated malignant ascites. No evidence of bowel obstruction on imaging. Given the question of potential obstructive component, we'll proceed with flexible sigmoidoscopy with biopsies tomorrow. The patient is higher-risk given his radiologic anatomy.The nature of the procedure, as well as the risks, benefits, and alternatives were carefully and thoroughly reviewed with the patient. Ample time for discussion and questions allowed. The patient understood, was satisfied, and agreed to proceed.. Discussed with patient and family. Will need oncology evaluation thereafter.  Docia Chuck. Geri Seminole., M.D. [facility] Division of Gastroenterology

## 2017-01-12 NOTE — Progress Notes (Signed)
PROGRESS NOTE    Thomas Wagner  RKY:[id number] DOB: 28-Nov-1940 DOA: 01/11/2017 PCP: [name], MD     Brief Narrative:  76 y.o. WM PMHx SDH 08/05/2005, Squamous Cell Carcinoma RIGHT hand Dx, HTN, prediabetes, Penile Squamous Cell Carcinoma Dx 01/03/2014 (seen by Dr. [facility]).  Presenting with decreased appetite, nausea, abdominal pain.   Patient notes that his symptoms started 2-3 weeks ago.  He notes that he noticed decreased appetite at that period of time.  He is also noticed left lower quadrant abdominal pain.  He describes as being constant, achy.  He notes that he can wear a belt anymore because it bothers him.  Having his hands loose feels better.  He denies any fevers, chills, cough, cold, chest pain, shortness of breath.  He does note loose stools recently.  He has never had a colonoscopy.  He recently had a biopsy of his foreskin with urology that was positive for squamous cell carcinoma.   ED Course: In the emergency department the patient had labs, imaging concerning for colon cancer with metastatic disease.  Plan was to admit given inability for patient to tolerate p.o. likely scope in the morning.    Subjective: 11/14 A/O 4, negative CP, negative SOB, positive penile pain but appropriate for recent surgery. Positive abdominal pain, negative N/V.         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 Consult Dr. Kathie [facility], in the a.m. Per patient arrangements had been made for patient to see oncology specialist at [facility]?  Sigmoid colon mass -Newly diagnosed sigmoid colon mass concerning for metastatic disease vs primary neoplasm. Peritoneal carcinomatosis? -Scheduled for colonoscopy on 11/15 -Consult oncology after results of  biopsy/resection in a.m.   Nausea, vomiting  Decreased PO:  Nutrition c/s IVF and antiemetics as above ADAT after procedure   Hyponatremia:  -Resolved .    Essential HTN -Amlodipine 5 mg daily -Lotensin 10 mg daily        DVT prophylaxis: SCD Code Status: Full Family Communication: Daughter at bedside Disposition Plan: TBD   Consultants:  GI  Procedures/Significant Events:  None  I have personally reviewed and interpreted all radiology studies and my findings are as above.  VENTILATOR SETTINGS:    Cultures   Antimicrobials:    Devices    LINES / TUBES:      Continuous Infusions: . dextrose 5 % and 0.9% NaCl 100 mL/hr at 01/12/17 0725     Objective: Vitals:   01/11/17 1409 01/11/17 1939 01/11/17 1959 01/12/17 0400  BP: 130/84 124/89 (!) 150/100 (!) 140/91  Pulse: 98 94 94 79  Resp:  14 18 20   Temp:   98.3 F (36.8 C) 98.5 F (36.9 C)  TempSrc:   Oral Oral  SpO2:  96% 97% 95%  Weight:   185 lb 3 oz (84 kg)   Height:   6' (1.829 m)     Intake/Output Summary (Last 24 hours) at 01/12/2017 1351 Last data filed at 01/12/2017 0309 Gross per 24 hour  Intake 590 ml  Output -  Net 590 ml   Filed Weights   01/11/17 1241 01/11/17 1959  Weight: 180 lb (81.6 kg) 185 lb 3 oz (84 kg)    Examination:  General: A/O 4, No acute respiratory distress Neck:  Negative scars, masses, torticollis, lymphadenopathy, JVD Lungs: Clear to auscultation bilaterally without wheezes or crackles Cardiovascular: Regular rate and rhythm without murmur gallop or rub normal S1 and S2 Abdomen: Positive mild abdominal pain, positive distention, positive soft, bowel sounds, no rebound, no ascites, no appreciable mass Extremities: No significant cyanosis, clubbing, or edema bilateral lower extremities Skin: Penile lesion/incision site healing erythematous painful to palpation.  Psychiatric:  Negative depression, negative anxiety, negative fatigue, negative mania    Central nervous system:  Cranial nerves II through XII intact, tongue/uvula midline, all extremities muscle strength 5/5, sensation intact throughout,  negative dysarthria, negative expressive aphasia, negative receptive aphasia.  .     Data Reviewed: Care during the described time interval was provided by me .  I have reviewed this patient's available data, including medical history, events of note, physical examination, and all test results as part of my evaluation.  CBC: Recent Labs  Lab 01/11/17 1359 01/12/17 0404  WBC 10.2 6.9  HGB 14.0 12.8*  HCT 39.2 37.2*  MCV 82.2 83.0  PLT 382 253   Basic Metabolic Panel: Recent Labs  Lab 01/11/17 1359 01/12/17 0404  NA 129* 135  K 4.0 3.9  CL 96* 105  CO2 22 23  GLUCOSE 117* 119*  BUN 12 10  CREATININE 0.99 0.87  CALCIUM 8.7* 8.5*   GFR: Estimated Creatinine Clearance: 79.3 mL/min (by C-G formula based on SCr of 0.87 mg/dL). Liver Function Tests: Recent Labs  Lab 01/11/17 1359 01/12/17 0404  AST 26 20  ALT 18 15*  ALKPHOS 77 67  BILITOT 0.5 0.6  PROT 7.0 6.2*  ALBUMIN 3.3* 3.1*   Recent Labs  Lab 01/11/17 1359  LIPASE 23   No results for input(s): AMMONIA in the last 168 hours. Coagulation Profile: No results for input(s): INR, PROTIME in the last 168 hours. Cardiac Enzymes: No results for input(s): CKTOTAL, CKMB, CKMBINDEX, TROPONINI in the last 168 hours. BNP (last 3 results) No results for input(s): PROBNP in the last 8760 hours. HbA1C: No results for input(s): HGBA1C in the last 72 hours. CBG: No results for input(s): GLUCAP in the last 168 hours. Lipid Profile: No results for input(s): CHOL, HDL, LDLCALC, TRIG, CHOLHDL, LDLDIRECT in the last 72 hours. Thyroid Function Tests: No results for input(s): TSH, T4TOTAL, FREET4, T3FREE, THYROIDAB in the last 72 hours. Anemia Panel: No results for input(s): VITAMINB12, FOLATE, FERRITIN, TIBC, IRON, RETICCTPCT in the last 72 hours. Sepsis Labs: No results  for input(s): PROCALCITON, LATICACIDVEN in the last 168 hours.  No results found for this or any previous visit (from the past 240 hour(s)).       Radiology Studies: Ct Abdomen Pelvis W Contrast  Result Date: 01/11/2017 CLINICAL DATA:  Abdominal pain, cramping, bloating and diarrhea for 1 month. EXAM: CT ABDOMEN AND PELVIS WITH CONTRAST TECHNIQUE: Multidetector CT imaging of the abdomen and pelvis was performed using the standard protocol following bolus administration of intravenous contrast. CONTRAST:  152mL ISOVUE-300 IOPAMIDOL (ISOVUE-300) INJECTION 61% COMPARISON:  None. FINDINGS: Lower chest: Small dependent left pleural effusion. Mild dependent left lower lobe atelectasis. Coronary atherosclerosis. Hepatobiliary: Normal liver with no liver mass. Normal gallbladder with no radiopaque cholelithiasis. No biliary ductal dilatation. Pancreas: Normal, with no mass or duct dilation. Spleen: Normal size. No mass. Adrenals/Urinary Tract: No discrete adrenal nodules. No hydronephrosis. Small simple parapelvic renal cysts in both kidneys. No suspicious renal masses. Normal bladder. Stomach/Bowel: Small hiatal hernia. Otherwise normal nondistended stomach. Normal caliber small bowel with no small bowel wall  thickening. Normal appendix. There is an irregular annular mass in the sigmoid colon measuring approximately 6.2 x 3.6 cm (series 2/ image 79). There is moderate left colonic diverticulosis. No significant large bowel dilatation. No additional sites of large bowel wall thickening. Vascular/Lymphatic: Atherosclerotic nonaneurysmal abdominal aorta. Patent portal, splenic, hepatic and renal veins. There are several clustered mildly enlarged lymph nodes in the sigmoid mesentery measuring up to 1.4 cm (series 2/ image 75). Reproductive: Top-normal size prostate. Other: There is extensive soft tissue caking throughout the omentum. Moderate volume ascites. Smooth peritoneal thickening and enhancement throughout  the peritoneal cavity. Musculoskeletal: No aggressive appearing focal osseous lesions. Marked thoracolumbar spondylosis. IMPRESSION: 1. Irregular annular 6.2 x 3.6 cm mass in the sigmoid colon suspicious for primary colonic malignancy. 2. Multiple mildly enlarged lymph nodes in the sigmoid mesenteries suspicious for locoregional nodal metastases. 3. Extensive soft tissue caking throughout the omentum. Smooth peritoneal thickening and enhancement throughout the peritoneal cavity. Moderate volume ascites. 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 4. Small dependent left pleural effusion. 5.  Aortic Atherosclerosis (ICD10-I70.0).  Coronary atherosclerosis. Electronically Signed   By: Ilona Sorrel M.D.   On: 01/11/2017 16:31        Scheduled Meds: . amLODipine  2.5 mg Oral QPM  . benazepril  10 mg Oral QPM   Continuous Infusions: . dextrose 5 % and 0.9% NaCl 100 mL/hr at 01/12/17 0725     LOS: 0 days    Time spent:40 min    WOODS, Geraldo Docker, MD [facility] Pager [phone]  If 7PM-7AM, please contact night-coverage [email or web address] Password [email or web address] 01/12/2017, 1:51 PM

## 2017-01-12 NOTE — H&P (View-Only) (Signed)
[facility] Consult: 10:26 AM 01/12/2017  LOS: 0 days    Referring Provider: Dr Sherral Hammers  Primary Care Physician:  [name], MD Primary Gastroenterologist:  unassigned     Reason for Consultation:  Sigmoid mass with obstructive GI sxs.     HPI: Thomas Wagner is a 76 y.o. male. intil recently, no previous serious health problems.  HTN.   S/P 01/03/17 circumcision for history of phimosis and palpable penile lesion.  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  Plan is for referral to [facility], per pt and family. Patient has never undergone colonoscopy or EGD.  He has declined PMD suggestions for colonoscopy in the past.    3 weeks of progressive early satiety.  In the last 3-4 days it has gotten to the point where he can hardly eat anything but he is able to tolerate coffee and sometimes water.  He is adapted to the situation so that he rarely is nauseated and has vomited only once.  When he eats he gets pain in his lower abdomen bilaterally.  He has had loose "runny" brown, nonbloody stools for about 6 weeks.  Generally has soft to loose stools once, at most twice daily.  In the last 6 weeks stool frequency has increased to about 3 times daily.  Abdomen has become more distended in the last week or so. Last week, PMD RXd Cipro/Flagyl for ? Infectious diarrhea, but pt unable to tolerate  He denies weight loss and dysphagia.  Rarely uses ibuprofen, took a couple of doses of this last week after his penile surgery.  Received oxycodone since he has been admitted and it does help the abdominal pain.  No dysuria or hematuria. Labs are mostly unremarkable.  He is Hgb 01/03/17 16.7, 14 on 11/13, 12.8 today.  Glucose slightly elevated in the 1teens.  UA unrevealing.    CT scan abdomen and pelvis with contrast  reveals sigmoid colon mass suspicious for malignancy with adjacent adenopathy suspicious for nodal metastasis.  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  Family history pertinent for pancreatic cancer which was the cause of his mother's death in her early 55s.  He has a daughter and a son who has had history of adenomatous colon polyps.  Past Medical History:  Diagnosis Date  . History of traumatic subdural hematoma    08-05-2005  --- pt fell off roof--- per imaging right posteroparietal-right parafalcine subdural hematoma w/ concussion --- per pt no residual  . Hypertension   . Nocturia   . Penile mass   . Phimosis   . Pre-diabetes   . Wears dentures    upper    Past Surgical History:  Procedure Laterality Date  . NO PAST SURGERIES      Prior to Admission medications   Medication Sig Start Date End Date Taking? Authorizing Provider  amLODipine (NORVASC) 5 MG tablet Take 2.5 mg by mouth every evening.   Yes [provider]  benazepril (LOTENSIN) 20 MG tablet Take 10 mg by  mouth every evening.   Yes [provider]  cephALEXin (KEFLEX) 500 MG capsule Take 500 mg 4 (four) times daily by mouth.   Yes [provider]  CINNAMON PO Take by mouth.   Yes [provider]  ciprofloxacin (CIPRO) 500 MG tablet Take 500 mg 2 (two) times daily by mouth. 01/06/17  Yes [provider]  GARLIC PO Take by mouth.   Yes [provider]  ibuprofen (ADVIL,MOTRIN) 200 MG tablet Take 400 mg 2 (two) times daily as needed by mouth.   Yes [provider]  metroNIDAZOLE (FLAGYL) 500 MG tablet Take 500 mg 3 (three) times daily by mouth. 11/15/16  Yes [provider]  oxycodone (OXY-IR) 5 MG capsule Take 1-2 capsules (5-10 mg total) every 4 (four) hours as needed by mouth. 01/03/17   Alla Feeling, MD    Scheduled Meds: . amLODipine  2.5 mg Oral QPM  .  benazepril  10 mg Oral QPM   Infusions: . dextrose 5 % and 0.9% NaCl 100 mL/hr at 01/12/17 0725   PRN Meds: acetaminophen **OR** acetaminophen, ondansetron **OR** ondansetron (ZOFRAN) IV, oxyCODONE   Allergies as of 01/11/2017  . (No Known Allergies)    Family History  Problem Relation Age of Onset  . Pancreatic cancer Mother   . Other Daughter        Precancerous lesions on colonoscopy  . Other Son        Precancerous lesion on colonoscopy    Social History   Socioeconomic History  . Marital status: Married    Spouse name: Not on file  . Number of children: Not on file  . Years of education: Not on file  . Highest education level: Not on file  Social Needs  . Financial resource strain: Not on file  . Food insecurity - worry: Not on file  . Food insecurity - inability: Not on file  . Transportation needs - medical: Not on file  . Transportation needs - non-medical: Not on file  Occupational History  . Not on file  Tobacco Use  . Smoking status: Former Smoker    Years: 5.00    Types: Cigarettes    Last attempt to quit: 12/24/1973    Years since quitting: 43.0  . Smokeless tobacco: Former Systems developer    Types: [place] date: 12/25/1978  Substance and Sexual Activity  . Alcohol use: No  . Drug use: No  . Sexual activity: Not on file  Other Topics Concern  . Not on file  Social History Narrative  . Not on file    REVIEW OF SYSTEMS: Constitutional: Fatigue and weakness in recent days with lack of p.o. intake. ENT:  No nose bleeds Pulm: No shortness of breath or cough. CV:  No palpitations, no LE edema.  No chest pain GU:  No hematuria, no frequency.  Surgical site on the penis is healing and not causing significant pain. GI:  Per HPI Heme: No excessive bleeding.  Develops purpura on his hands and arms easily. Transfusions: None ever. Neuro:  No headaches, no peripheral tingling or numbness.  No seizures.  No syncope. Derm:  No itching, no rash or sores.    Endocrine:  No sweats or chills.  No polyuria or dysuria Immunization:  Did not inquire as to recent vaccines Travel:  None beyond local counties in last few months.    PHYSICAL EXAM: Vital signs in last 24 hours: Vitals:   01/11/17 1959 01/12/17 0400  BP:  (!) 140/91  Pulse: 94 79  Resp: 18 20  Temp: 98.3 F (36.8 C) 98.5 F (36.9 C)  SpO2: 97% 95%   Wt Readings from Last 3 Encounters:  01/11/17 84 kg (185 lb 3 oz)  01/03/17 85.7 kg (189 lb)    General: Pleasant, alert, well-appearing WM who is sitting up in bed comfortably.  Looks younger than stated age Head: No facial asymmetry or swelling.  No signs of head trauma. Eyes: No scleral icterus, no conjunctival pallor.  EOMI. Ears: Not hard of hearing Nose: No congestion or discharge Mouth: Oral mucosa pink, moist, clear.  Tongue midline and coated with nonspecific white exudate.  Dentures in place.   Neck: No JVD, no masses, no thyromegaly. Lungs: Very fine rales at the left base.  No labored breathing.  No cough. Heart: RRR.  No MRG.  S1, S2 present. Abdomen: Soft.  Minor bilateral, lower abdominal tenderness.  No masses.  No HSM.  No tingling, tympanitic or general bowel sounds.  Abdomen is quiet.  Not distended. Rectal: Deferred rectal exam. GU: Shallow, erythematous ulcer on the right side of the glans Musc/Skeltl: No joint erythema, swelling or gross deformity. Extremities: No CCE. Neurologic: Alert.  Oriented x3.  No tremor, no limb weakness. Skin: No rashes or sores.  No significant bruising. Tattoos: None observed. Nodes: No inguinal or cervical adenopathy. Psych: Calm, cooperative.  Not anxious or depressed.  Intake/Output from previous day: 11/13 0701 - 11/14 0700 In: 590 [I.V.:590] Out: -  Intake/Output this shift: No intake/output data recorded.  LAB RESULTS: Recent Labs    01/11/17 1359 01/12/17 0404  WBC 10.2 6.9  HGB 14.0 12.8*  HCT 39.2 37.2*  PLT 382 359   BMET Lab Results  Component  Value Date   NA 135 01/12/2017   NA 129 (L) 01/11/2017   NA 133 (L) 01/03/2017   K 3.9 01/12/2017   K 4.0 01/11/2017   K 4.1 01/03/2017   CL 105 01/12/2017   CL 96 (L) 01/11/2017   CL 94 (L) 01/03/2017   CO2 23 01/12/2017   CO2 22 01/11/2017   GLUCOSE 119 (H) 01/12/2017   GLUCOSE 117 (H) 01/11/2017   GLUCOSE 109 (H) 01/03/2017   BUN 10 01/12/2017   BUN 12 01/11/2017   BUN 9 01/03/2017   CREATININE 0.87 01/12/2017   CREATININE 0.99 01/11/2017   CREATININE 1.00 01/03/2017   CALCIUM 8.5 (L) 01/12/2017   CALCIUM 8.7 (L) 01/11/2017   LFT Recent Labs    01/11/17 1359 01/12/17 0404  PROT 7.0 6.2*  ALBUMIN 3.3* 3.1*  AST 26 20  ALT 18 15*  ALKPHOS 77 67  BILITOT 0.5 0.6   PT/INR No results found for: INR, PROTIME Hepatitis Panel No results for input(s): HEPBSAG, HCVAB, HEPAIGM, HEPBIGM in the last 72 hours. C-Diff No components found for: CDIFF Lipase     Component Value Date/Time   LIPASE 23 01/11/2017 1359    Drugs of Abuse  No results found for: LABOPIA, COCAINSCRNUR, LABBENZ, AMPHETMU, THCU, LABBARB   RADIOLOGY STUDIES: Ct Abdomen Pelvis W Contrast  Result Date: 01/11/2017 CLINICAL DATA:  Abdominal pain, cramping, bloating and diarrhea for 1 month. EXAM: CT ABDOMEN AND PELVIS WITH CONTRAST TECHNIQUE: Multidetector CT imaging of the abdomen and pelvis was performed using the standard protocol following bolus administration of intravenous contrast. CONTRAST:  156mL ISOVUE-300 IOPAMIDOL (ISOVUE-300) INJECTION 61% COMPARISON:  None. FINDINGS: Lower chest: Small dependent left pleural effusion. Mild dependent left lower lobe atelectasis. Coronary  atherosclerosis. Hepatobiliary: Normal liver with no liver mass. Normal gallbladder with no radiopaque cholelithiasis. No biliary ductal dilatation. Pancreas: Normal, with no mass or duct dilation. Spleen: Normal size. No mass. Adrenals/Urinary Tract: No discrete adrenal nodules. No hydronephrosis. Small simple parapelvic renal  cysts in both kidneys. No suspicious renal masses. Normal bladder. Stomach/Bowel: Small hiatal hernia. Otherwise normal nondistended stomach. Normal caliber small bowel with no small bowel wall thickening. Normal appendix. There is an irregular annular mass in the sigmoid colon measuring approximately 6.2 x 3.6 cm (series 2/ image 79). There is moderate left colonic diverticulosis. No significant large bowel dilatation. No additional sites of large bowel wall thickening. Vascular/Lymphatic: Atherosclerotic nonaneurysmal abdominal aorta. Patent portal, splenic, hepatic and renal veins. There are several clustered mildly enlarged lymph nodes in the sigmoid mesentery measuring up to 1.4 cm (series 2/ image 75). Reproductive: Top-normal size prostate. Other: There is extensive soft tissue caking throughout the omentum. Moderate volume ascites. Smooth peritoneal thickening and enhancement throughout the peritoneal cavity. Musculoskeletal: No aggressive appearing focal osseous lesions. Marked thoracolumbar spondylosis. IMPRESSION: 1. Irregular annular 6.2 x 3.6 cm mass in the sigmoid colon suspicious for primary colonic malignancy. 2. Multiple mildly enlarged lymph nodes in the sigmoid mesenteries suspicious for locoregional nodal metastases. 3. Extensive soft tissue caking throughout the omentum. Smooth peritoneal thickening and enhancement throughout the peritoneal cavity. Moderate volume ascites. 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 4. Small dependent left pleural effusion. 5.  Aortic Atherosclerosis (ICD10-I70.0).  Coronary atherosclerosis. Electronically Signed   By: Ilona Sorrel M.D.   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   Margins of resected mass not tumor-free.  Squamous cell carcinoma could also be a source for the omental caking.   PLAN:     *  Plan flex sig tomorrow 9AM with enema-based bowel prep as he would not tolerate oral prep required for complete colonoscopy.      Azucena Freed  01/12/2017, 10:26 AM Pager: [phone]  GI ATTENDING  History, laboratories, x-rays reviewed. Patient personally seen and examined. Multiple family members in room. Agree with comprehensive consultation note as outlined above. Patient presents to the hospital with 6-8 week history of change in bowel habits (more difficulty) and abdominal distention. Intermittent vomiting with overeating. CT scan worrisome for sigmoid colon cancer with test assist to the peritoneal cavity with associated malignant ascites. No evidence of bowel obstruction on imaging. Given the question of potential obstructive component, we'll proceed with flexible sigmoidoscopy with biopsies tomorrow. The patient is higher-risk given his radiologic anatomy.The nature of the procedure, as well as the risks, benefits, and alternatives were carefully and thoroughly reviewed with the patient. Ample time for discussion and questions allowed. The patient understood, was satisfied, and agreed to proceed.. Discussed with patient and family. Will need oncology evaluation thereafter.  Docia Chuck. Geri Seminole., M.D. [facility] Division of Gastroenterology

## 2017-01-12 NOTE — Progress Notes (Signed)
Initial Nutrition Assessment  INTERVENTION:   -Diet advancement per MD -Once diet advanced, provide Ensure Enlive po BID, each supplement provides 350 kcal and 20 grams of protein -RD will continue to monitor  NUTRITION DIAGNOSIS:   Inadequate oral intake related to inability to eat as evidenced by NPO status.  GOAL:   Patient will meet greater than or equal to 90% of their needs  MONITOR:   Diet advancement, Labs, Weight trends, I & O's  REASON FOR ASSESSMENT:   Consult Assessment of nutrition requirement/status  ASSESSMENT:   76 y.o. male with medical history significant of hypertension, squamous cell carcinoma presenting with decreased appetite, nausea, abdominal pain.  Patient in room with wife and daughter at bedside. Pt reports not having an appetite for ~2 weeks now. Pt states it gradually got worse and when he would try to force himself to eat, he would develop nausea. He felt that the food would come back up if he continued to eat. Pt states he started drinking 1 Ensure a day for a few days until 11/11. Pt denies any issues with reflux or swallowing.  RD to order Ensure supplements once diet is advanced. Pt currently NPO.  Pt reports UBW of 180 lb. Pt is up +5 lb. Pt states he weighs himself without clothes every day and his weight has been stable regardless if he has been eating.  Labs reviewed. Medications: D5 -.9% NaCl infusion at 100 ml/hr -provides 408 kcal  NUTRITION - FOCUSED PHYSICAL EXAM:  Nutrition focused physical exam shows no sign of depletion of muscle mass or body fat.  Diet Order:  Diet NPO time specified  EDUCATION NEEDS:   No education needs have been identified at this time  Skin:  Skin Assessment: Reviewed RN Assessment  Last BM:  11/14  Height:   Ht Readings from Last 1 Encounters:  01/11/17 6' (1.829 m)    Weight:   Wt Readings from Last 1 Encounters:  01/11/17 185 lb 3 oz (84 kg)    Ideal Body Weight:  80.9 kg  BMI:   Body mass index is 25.12 kg/m.  Estimated Nutritional Needs:   Kcal:  2000-2200  Protein:  100-110g  Fluid:  2L/day  Clayton Bibles, MS, RD, LDN [place] [facility] Pager: [phone] After Hours Pager: [phone]

## 2017-01-13 ENCOUNTER — Encounter (HOSPITAL_COMMUNITY): Payer: Self-pay | Admitting: *Deleted

## 2017-01-13 ENCOUNTER — Other Ambulatory Visit: Payer: Self-pay

## 2017-01-13 ENCOUNTER — Encounter (HOSPITAL_COMMUNITY)
Admission: EM | Disposition: A | Discharge status: Home Health | Payer: Self-pay | Source: Home / Self Care | Attending: Internal Medicine

## 2017-01-13 DIAGNOSIS — C609 Malignant neoplasm of penis, unspecified: Secondary | ICD-10-CM

## 2017-01-13 DIAGNOSIS — R112 Nausea with vomiting, unspecified: Secondary | ICD-10-CM

## 2017-01-13 DIAGNOSIS — K56609 Unspecified intestinal obstruction, unspecified as to partial versus complete obstruction: Secondary | ICD-10-CM

## 2017-01-13 DIAGNOSIS — R1032 Left lower quadrant pain: Secondary | ICD-10-CM

## 2017-01-13 DIAGNOSIS — R109 Unspecified abdominal pain: Secondary | ICD-10-CM

## 2017-01-13 DIAGNOSIS — K6389 Other specified diseases of intestine: Secondary | ICD-10-CM

## 2017-01-13 DIAGNOSIS — R140 Abdominal distension (gaseous): Secondary | ICD-10-CM

## 2017-01-13 DIAGNOSIS — C187 Malignant neoplasm of sigmoid colon: Principal | ICD-10-CM

## 2017-01-13 DIAGNOSIS — R188 Other ascites: Secondary | ICD-10-CM

## 2017-01-13 DIAGNOSIS — C801 Malignant (primary) neoplasm, unspecified: Secondary | ICD-10-CM

## 2017-01-13 DIAGNOSIS — D490 Neoplasm of unspecified behavior of digestive system: Secondary | ICD-10-CM

## 2017-01-13 HISTORY — PX: FLEXIBLE SIGMOIDOSCOPY: SHX5431

## 2017-01-13 LAB — SURGICAL PCR SCREEN
MRSA, PCR: NEGATIVE
STAPHYLOCOCCUS AUREUS: NEGATIVE

## 2017-01-13 SURGERY — SIGMOIDOSCOPY, FLEXIBLE
Anesthesia: Moderate Sedation

## 2017-01-13 MED ORDER — FENTANYL CITRATE (PF) 100 MCG/2ML IJ SOLN
INTRAMUSCULAR | Status: AC
  Filled 2017-01-13: qty 2

## 2017-01-13 MED ORDER — SODIUM CHLORIDE 0.9 % IV SOLN
INTRAVENOUS | Status: DC
  Administered 2017-01-13: 08:00:00 via INTRAVENOUS

## 2017-01-13 MED ORDER — DIPHENHYDRAMINE HCL 50 MG/ML IJ SOLN
INTRAMUSCULAR | Status: AC
  Filled 2017-01-13: qty 1

## 2017-01-13 MED ORDER — MIDAZOLAM HCL 10 MG/2ML IJ SOLN
INTRAMUSCULAR | Status: DC | PRN
  Administered 2017-01-13 (×2): 1 mg via INTRAVENOUS
  Administered 2017-01-13: 2 mg via INTRAVENOUS

## 2017-01-13 MED ORDER — DEXTROSE 5 % IV SOLN
2.0000 g | INTRAVENOUS | Status: AC
  Administered 2017-01-14: 2 g via INTRAVENOUS
  Filled 2017-01-13: qty 2

## 2017-01-13 MED ORDER — DEXTROSE 5 % IV SOLN: 2.0000 g | INTRAVENOUS | Status: DC

## 2017-01-13 MED ORDER — MIDAZOLAM HCL 5 MG/ML IJ SOLN
INTRAMUSCULAR | Status: AC
  Filled 2017-01-13: qty 2

## 2017-01-13 NOTE — Progress Notes (Signed)
PROGRESS NOTE    Thomas Wagner  GXQ:[id number] DOB: 06/25/1940 DOA: 01/11/2017 PCP: [name], MD     Brief Narrative:  76 y.o. WM PMHx SDH 08/05/2005, Squamous Cell Carcinoma RIGHT hand Dx, HTN, prediabetes, Penile Squamous Cell Carcinoma Dx 01/03/2014 (seen by Dr. Kathie [facility]).  Presenting with decreased appetite, nausea, abdominal pain.   Patient notes that his symptoms started 2-3 weeks ago.  He notes that he noticed decreased appetite at that period of time.  He is also noticed left lower quadrant abdominal pain.  He describes as being constant, achy.  He notes that he can wear a belt anymore because it bothers him.  Having his hands loose feels better.  He denies any fevers, chills, cough, cold, chest pain, shortness of breath.  He does note loose stools recently.  He has never had a colonoscopy.  He recently had a biopsy of his foreskin with urology that was positive for squamous cell carcinoma.   ED Course: In the emergency department the patient had labs, imaging concerning for colon cancer with metastatic disease.  Plan was to admit given inability for patient to tolerate p.o. likely scope in the morning.    Subjective: 11/15 A/O 4, negative CP, negative SOB, positive abdominal pain, negative N/V.     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 Consult Dr. Kathie [facility] after patient's colon cancer issues resolved. -Although, Dr. Kathie Rhodes [facility] wanted patient to see oncology specialist in [facility], will not be stable for several weeks.   Sigmoid colon mass -Newly diagnosed sigmoid colon mass: Large obstructing mass most likely a second primary tumor. -Consulted Dr. Leighton Ruff surgery. Patient scheduled for colon resection on  11/16 -Discussed case with Dr. Jana Hakim Oncology will see patient after resection of colon mass. -Results of Biopsy of colon mass pending.   Nausea, vomiting  Decreased PO:  -NPO   Hyponatremia:  -Resolved .  Essential HTN -Amlodipine 5 mg daily -Lotensin 10 mg daily        DVT prophylaxis:  Code Status: Full Family Communication: Daughter at bedside Disposition Plan: TBD   Consultants:  GI  Procedures/Significant Events:  11/15 Sigmoidoscopy:  - Likely malignant completely obstructing tumor in  the recto-sigmoid colon. Biopsied.      I have personally reviewed and interpreted all radiology studies and my findings are as above.  VENTILATOR SETTINGS:    Cultures 11/15 Colon Biopsy pending     Antimicrobials:    Devices    LINES / TUBES:      Continuous Infusions: . dextrose 5 % and 0.9% NaCl 100 mL/hr at 01/12/17 0725     Objective: Vitals:   01/11/17 1409 01/11/17 1939 01/11/17 1959 01/12/17 0400  BP: 130/84 124/89 (!) 150/100 (!) 140/91  Pulse: 98 94 94 79  Resp:  14 18 20   Temp:   98.3 F (36.8 C) 98.5 F (36.9 C)  TempSrc:   Oral Oral  SpO2:  96% 97% 95%  Weight:   185 lb 3 oz (84 kg)   Height:   6' (1.829 m)     Intake/Output Summary (Last 24 hours) at 01/12/2017 1351 Last data filed at 01/12/2017 0309 Gross per 24 hour  Intake 590 ml  Output -  Net 590 ml   [employer]  01/11/17 1241 01/11/17 1959  Weight: 180 lb (81.6 kg) 185 lb 3 oz (84 kg)    Physical Exam:  General: A/O 4, No acute respiratory distress Neck:  Negative scars, masses, torticollis, lymphadenopathy, JVD Lungs: Clear to auscultation bilaterally without wheezes or crackles Cardiovascular: Regular rate and rhythm without murmur gallop or rub normal S1 and S2 Abdomen: Positive mild abdominal pain, positive distention, positive soft, bowel sounds, no rebound, no ascites, no appreciable mass Extremities: No significant cyanosis, clubbing, or edema  bilateral lower extremities Skin: Penile lesion/incision site healing erythematous painful to palpation.  Psychiatric:  Negative depression, negative anxiety, negative fatigue, negative mania  Central nervous system:  Cranial nerves II through XII intact, tongue/uvula midline, all extremities muscle strength 5/5, sensation intact throughout,  negative dysarthria, negative expressive aphasia, negative receptive aphasia.  .     Data Reviewed: Care during the described time interval was provided by me .  I have reviewed this patient's available data, including medical history, events of note, physical examination, and all test results as part of my evaluation.  CBC: Recent Labs  Lab 01/11/17 1359 01/12/17 0404  WBC 10.2 6.9  HGB 14.0 12.8*  HCT 39.2 37.2*  MCV 82.2 83.0  PLT 382 160   Basic Metabolic Panel: Recent Labs  Lab 01/11/17 1359 01/12/17 0404  NA 129* 135  K 4.0 3.9  CL 96* 105  CO2 22 23  GLUCOSE 117* 119*  BUN 12 10  CREATININE 0.99 0.87  CALCIUM 8.7* 8.5*   GFR: Estimated Creatinine Clearance: 79.3 mL/min (by C-G formula based on SCr of 0.87 mg/dL). Liver Function Tests: Recent Labs  Lab 01/11/17 1359 01/12/17 0404  AST 26 20  ALT 18 15*  ALKPHOS 77 67  BILITOT 0.5 0.6  PROT 7.0 6.2*  ALBUMIN 3.3* 3.1*   Recent Labs  Lab 01/11/17 1359  LIPASE 23   No results for input(s): AMMONIA in the last 168 hours. Coagulation Profile: No results for input(s): INR, PROTIME in the last 168 hours. Cardiac Enzymes: No results for input(s): CKTOTAL, CKMB, CKMBINDEX, TROPONINI in the last 168 hours. BNP (last 3 results) No results for input(s): PROBNP in the last 8760 hours. HbA1C: No results for input(s): HGBA1C in the last 72 hours. CBG: No results for input(s): GLUCAP in the last 168 hours. Lipid Profile: No results for input(s): CHOL, HDL, LDLCALC, TRIG, CHOLHDL, LDLDIRECT in the last 72 hours. Thyroid Function Tests: No results for input(s): TSH,  T4TOTAL, FREET4, T3FREE, THYROIDAB in the last 72 hours. Anemia Panel: No results for input(s): VITAMINB12, FOLATE, FERRITIN, TIBC, IRON, RETICCTPCT in the last 72 hours. Sepsis Labs: No results for input(s): PROCALCITON, LATICACIDVEN in the last 168 hours.  No results found for this or any previous visit (from the past 240 hour(s)).       Radiology Studies: Ct Abdomen Pelvis W Contrast  Result Date: 01/11/2017 CLINICAL DATA:  Abdominal pain, cramping, bloating and diarrhea for 1 month. EXAM: CT ABDOMEN AND PELVIS WITH CONTRAST TECHNIQUE: Multidetector CT imaging of the abdomen and pelvis was performed using the standard protocol following bolus administration of intravenous contrast. CONTRAST:  125mL ISOVUE-300 IOPAMIDOL (ISOVUE-300) INJECTION 61% COMPARISON:  None. FINDINGS: Lower chest: Small dependent left pleural effusion. Mild dependent left lower lobe atelectasis. Coronary atherosclerosis. Hepatobiliary: Normal liver with no liver mass. Normal gallbladder with no radiopaque cholelithiasis. No biliary ductal dilatation. Pancreas: Normal, with no mass or duct dilation. Spleen: Normal size. No mass. Adrenals/Urinary Tract: No discrete adrenal nodules. No hydronephrosis. Small  simple parapelvic renal cysts in both kidneys. No suspicious renal masses. Normal bladder. Stomach/Bowel: Small hiatal hernia. Otherwise normal nondistended stomach. Normal caliber small bowel with no small bowel wall thickening. Normal appendix. There is an irregular annular mass in the sigmoid colon measuring approximately 6.2 x 3.6 cm (series 2/ image 79). There is moderate left colonic diverticulosis. No significant large bowel dilatation. No additional sites of large bowel wall thickening. Vascular/Lymphatic: Atherosclerotic nonaneurysmal abdominal aorta. Patent portal, splenic, hepatic and renal veins. There are several clustered mildly enlarged lymph nodes in the sigmoid mesentery measuring up to 1.4 cm (series 2/  image 75). Reproductive: Top-normal size prostate. Other: There is extensive soft tissue caking throughout the omentum. Moderate volume ascites. Smooth peritoneal thickening and enhancement throughout the peritoneal cavity. Musculoskeletal: No aggressive appearing focal osseous lesions. Marked thoracolumbar spondylosis. IMPRESSION: 1. Irregular annular 6.2 x 3.6 cm mass in the sigmoid colon suspicious for primary colonic malignancy. 2. Multiple mildly enlarged lymph nodes in the sigmoid mesenteries suspicious for locoregional nodal metastases. 3. Extensive soft tissue caking throughout the omentum. Smooth peritoneal thickening and enhancement throughout the peritoneal cavity. Moderate volume ascites. 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 4. Small dependent left pleural effusion. 5.  Aortic Atherosclerosis (ICD10-I70.0).  Coronary atherosclerosis. Electronically Signed   By: Ilona Sorrel M.D.   On: 01/11/2017 16:31        Scheduled Meds: . amLODipine  2.5 mg Oral QPM  . benazepril  10 mg Oral QPM   Continuous Infusions: . dextrose 5 % and 0.9% NaCl 100 mL/hr at 01/12/17 0725     LOS: 0 days    Time spent:40 min    Saleemah Mollenhauer, Geraldo Docker, MD [facility] Pager [phone]  If 7PM-7AM, please contact night-coverage [email or web address] Password [email or web address] 01/12/2017, 1:51 PM

## 2017-01-13 NOTE — Consult Note (Signed)
[place]  [phone]   HEMATOLOGY ONCOLOGY INPATIENT CONSULTATION   BYRD RUSHLOW  DOB: [birth year]  MR#: [id number]  CSN#: [id number]    Requesting Physician: [facility]  Patient Care Team: [name], MD as PCP - General (Family Medicine)  Reason for consult: Metastatic colon cancer  History of present illness: [name] is a 76 year old Caucasian male, without significant past medical history except hypertension, presented with worsening abdominal bloating and pain, nausea, and constipation for the past 2-3 weeks, no significant weight loss.  Patient states his symptoms started about 6 weeks ago, worse in the past few weeks.  His abdominal pain is mainly located in the left lower quadrant, constant, tolerable.   He was admitted on January 11, 2017.  CT of abdomen and pelvis with contrast which showed a irregular large mass in the sigmoid colon, multiple enlarged lymph nodes in the sigmoid mesentery, extensive soft tissue caking throughout of the omentum and moderate ascites, suspicious for peritoneal metastasis.  He underwent flexible sigmoidoscopy by Dr. Loletha Carrow today, which showed a complete obstructed sigmoid colon mass, biopsy was taken.  Results are pending.  He was seen by corrective surgeon Dr. Marcello Moores today, she plan to take pt to OR for laparoscopic Hartman's procedure tomorrow.   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 Karsten Ro. Per pt, Dr. Karsten Ro plans to refer him to [facility] for complete surgical resection.  MEDICAL HISTORY:  Past Medical History:  Diagnosis Date  . History of traumatic subdural hematoma    08-05-2005  --- pt fell off roof--- per imaging right posteroparietal-right parafalcine subdural hematoma w/ concussion --- per pt no residual  . Hypertension   . Nocturia   . Penile mass   . Phimosis   . Pre-diabetes   . Wears dentures    upper     SURGICAL HISTORY: Past Surgical History:  Procedure Laterality Date  . CIRCUMCISION N/A 01/03/2017   Procedure: CIRCUMCISION ADULT/ POSSIBLE PENILE BIOPSY;  Surgeon: Kathie Rhodes, MD;  Location: [facility];  Service: Urology;  Laterality: N/A;  . NO PAST SURGERIES      SOCIAL HISTORY: Social History   Socioeconomic History  . Marital status: Married    Spouse name: Not on file  . Number of children: Not on file  . Years of education: Not on file  . Highest education level: Not on file  Social Needs  . Financial resource strain: Not on file  . Food insecurity - worry: Not on file  . Food insecurity - inability: Not on file  . Transportation needs - medical: Not on file  . Transportation needs - non-medical: Not on file  Occupational History  . Not on file  Tobacco Use  . Smoking status: Former Smoker    Years: 5.00    Types: Cigarettes    Last attempt to quit: 12/24/1973    Years since quitting: 43.0  . Smokeless tobacco: Former Systems developer    Types: [place] date: 12/25/1978  Substance and Sexual Activity  . Alcohol use: No  . Drug use: No  . Sexual activity: Not on file  Other Topics Concern  . Not on file  Social History Narrative  . Not on file    FAMILY HISTORY: Family History  Problem Relation Age of Onset  . Pancreatic cancer Mother   . Other Daughter        Precancerous  lesions on colonoscopy  . Other Son        Precancerous lesion on colonoscopy    ALLERGIES:  has No Known Allergies.  MEDICATIONS:  Current Facility-Administered Medications  Medication Dose Route Frequency Provider Last Rate Last Dose  . acetaminophen (TYLENOL) tablet 650 mg  650 mg Oral Q6H PRN Elodia Florence., MD       Or  . acetaminophen (TYLENOL) suppository 650 mg  650 mg Rectal Q6H PRN Elodia Florence., MD      . amLODipine (NORVASC) tablet 5 mg  5 mg Oral QPM Allie Bossier, MD      . benazepril (LOTENSIN) tablet 10 mg  10 mg Oral QPM  Elodia Florence., MD   10 mg at 01/12/17 1427  . [START ON 01/14/2017] cefoTEtan (CEFOTAN) 2 g in dextrose 5 % 50 mL IVPB  2 g Intravenous On Call to OR Leighton Ruff, MD      . dextrose 5 %-0.9 % sodium chloride infusion   Intravenous Continuous Elodia Florence., MD 100 mL/hr at 01/13/17 0216    . ondansetron (ZOFRAN) tablet 4 mg  4 mg Oral Q6H PRN Elodia Florence., MD       Or  . ondansetron [facility]) injection 4 mg  4 mg Intravenous Q6H PRN Elodia Florence., MD      . oxyCODONE (Oxy IR/ROXICODONE) immediate release tablet 5 mg  5 mg Oral Q4H PRN Elodia Florence., MD   5 mg at 01/13/17 1036    REVIEW OF SYSTEMS:   Constitutional: Denies fevers, chills or abnormal night sweats Eyes: Denies blurriness of vision, double vision or watery eyes Ears, nose, mouth, throat, and face: Denies mucositis or sore throat Respiratory: Denies cough, dyspnea or wheezes Cardiovascular: Denies palpitation, chest discomfort or lower extremity swelling Gastrointestinal:  See HPI  Skin: Denies abnormal skin rashes Lymphatics: Denies new lymphadenopathy or easy bruising Neurological:Denies numbness, tingling or new weaknesses Behavioral/Psych: Mood is stable, no new changes  All other systems were reviewed with the patient and are negative.  PHYSICAL EXAMINATION: ECOG PERFORMANCE STATUS: 2 - Symptomatic, <50% confined to bed  Vitals:   01/13/17 0950 01/13/17 1049  BP:    Pulse: 94   Resp: (!) 23   Temp:    SpO2: 95% 94%   Filed Weights   01/11/17 1241 01/11/17 1959  Weight: 180 lb (81.6 kg) 185 lb 3 oz (84 kg)    GENERAL:alert, no distress and comfortable SKIN: skin color, texture, turgor are normal, no rashes or significant lesions EYES: normal, conjunctiva are pink and non-injected, sclera clear OROPHARYNX:no exudate, no erythema and lips, buccal mucosa, and tongue normal  NECK: supple, thyroid normal size, non-tender, without nodularity LYMPH:  no palpable  lymphadenopathy in the cervical, axillary or inguinal LUNGS: clear to auscultation and percussion with normal breathing effort HEART: regular rate & rhythm and no murmurs and no lower extremity edema ABDOMEN:abdomen soft, (+) distended, with mild diffuse tenderness, decreased bowel sounds, no palpable organomegaly, (+) asctes Musculoskeletal:no cyanosis of digits and no clubbing  PSYCH: alert & oriented x 3 with fluent speech NEURO: no focal motor/sensory deficits  LABORATORY DATA:  I have reviewed the data as listed Lab Results  Component Value Date   WBC 6.9 01/12/2017   HGB 12.8 (L) 01/12/2017   HCT 37.2 (L) 01/12/2017   MCV 83.0 01/12/2017   PLT 359 01/12/2017   Recent Labs    01/03/17 0938 01/11/17  1359 01/12/17 0404  NA 133* 129* 135  K 4.1 4.0 3.9  CL 94* 96* 105  CO2  --  22 23  GLUCOSE 109* 117* 119*  BUN 9 12 10   CREATININE 1.00 0.99 0.87  CALCIUM  --  8.7* 8.5*  GFRNONAA  --  >60 >60  GFRAA  --  >60 >60  PROT  --  7.0 6.2*  ALBUMIN  --  3.3* 3.1*  AST  --  26 20  ALT  --  18 15*  ALKPHOS  --  77 67  BILITOT  --  0.5 0.6    RADIOGRAPHIC STUDIES: I have personally reviewed the radiological images as listed and agreed with the findings in the report. Ct Abdomen Pelvis W Contrast  Result Date: 01/11/2017 CLINICAL DATA:  Abdominal pain, cramping, bloating and diarrhea for 1 month. EXAM: CT ABDOMEN AND PELVIS WITH CONTRAST TECHNIQUE: Multidetector CT imaging of the abdomen and pelvis was performed using the standard protocol following bolus administration of intravenous contrast. CONTRAST:  176mL ISOVUE-300 IOPAMIDOL (ISOVUE-300) INJECTION 61% COMPARISON:  None. FINDINGS: Lower chest: Small dependent left pleural effusion. Mild dependent left lower lobe atelectasis. Coronary atherosclerosis. Hepatobiliary: Normal liver with no liver mass. Normal gallbladder with no radiopaque cholelithiasis. No biliary ductal dilatation. Pancreas: Normal, with no mass or duct  dilation. Spleen: Normal size. No mass. Adrenals/Urinary Tract: No discrete adrenal nodules. No hydronephrosis. Small simple parapelvic renal cysts in both kidneys. No suspicious renal masses. Normal bladder. Stomach/Bowel: Small hiatal hernia. Otherwise normal nondistended stomach. Normal caliber small bowel with no small bowel wall thickening. Normal appendix. There is an irregular annular mass in the sigmoid colon measuring approximately 6.2 x 3.6 cm (series 2/ image 79). There is moderate left colonic diverticulosis. No significant large bowel dilatation. No additional sites of large bowel wall thickening. Vascular/Lymphatic: Atherosclerotic nonaneurysmal abdominal aorta. Patent portal, splenic, hepatic and renal veins. There are several clustered mildly enlarged lymph nodes in the sigmoid mesentery measuring up to 1.4 cm (series 2/ image 75). Reproductive: Top-normal size prostate. Other: There is extensive soft tissue caking throughout the omentum. Moderate volume ascites. Smooth peritoneal thickening and enhancement throughout the peritoneal cavity. Musculoskeletal: No aggressive appearing focal osseous lesions. Marked thoracolumbar spondylosis. IMPRESSION: 1. Irregular annular 6.2 x 3.6 cm mass in the sigmoid colon suspicious for primary colonic malignancy. 2. Multiple mildly enlarged lymph nodes in the sigmoid mesenteries suspicious for locoregional nodal metastases. 3. Extensive soft tissue caking throughout the omentum. Smooth peritoneal thickening and enhancement throughout the peritoneal cavity. Moderate volume ascites. 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 4. Small dependent left pleural effusion. 5.  Aortic Atherosclerosis (ICD10-I70.0).  Coronary atherosclerosis. Electronically Signed   By: Ilona Sorrel M.D.   On: 01/11/2017 16:31   Flexible sigmoidoscopy Dr. Loletha Carrow 01/13/2017  Findings: A fungating, infiltrative and ulcerated completely obstructing mass was found in  the recto-sigmoid colon (about 20 cm from anal verge). The mass was circumferential. Mucosa was biopsied with a cold forceps for histology. One specimen bottle was sent to pathology. The exam was otherwise without abnormality. - Likely malignant completely obstructing tumor in the recto-sigmoid colon. Biopsied. - The examination was otherwise normal.   ASSESSMENT & PLAN: 76 year old Caucasian male, without significant past medical history, except hypertension, presented with abdominal bloating, nausea, left lower quadrant abdominal pain, and constipation.  1.  Sigmoid colon cancer, with bowel obstruction  2. 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  Squamous cell carcinoma of penis, s/p resection  4.  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  Biopsy pathology report is still pending -please get CT chest wo contrast to complete staging  -Due to his bowel obstruction, he is scheduled to have Hartmann's procedure to remove primary tumor and adjacent lymph nodes, and colostomy by Dr. Marcello Moores tomorrow  -I spoke with Dr. Marcello Moores, she is planning to biopsy omentum if there is suspicion for metastasis under laparoscopy  -I reviewed the nature history of metastatic colon cancer, and treatment options.  I recommend him to consider systemic chemotherapy, such as FOLFOX, if biopsy confirms he has metastatic disease.  The goal of therapy would be palliative, to control his disease, prolong his life.  We discussed the role of HIPEC, due to his advanced age, I am not sure if he would be a candidate.  -CEA has been ordered.  -I plan to see him back in 2 weeks after his discharge. I have asked Dr. Marcello Moores to place a port after his surgery .  -I will f/u as needed when he is in the hospital.    All questions were answered. The  patient knows to call the clinic with any problems, questions or concerns.      Truitt Merle, MD 01/13/2017 4:35 PM

## 2017-01-13 NOTE — Consult Note (Signed)
[facility] Surgery Consult Note  Thomas Wagner 06-11-1940  [id number].    Requesting MD: Sherral Hammers, MD Chief Complaint/Reason for Consult: obstructing sigmoid colon mass  HPI: Thomas Wagner is a 76 y/o male with a PMH HTN and recently diagnosed SCC of the penis who presented to [facility] 01/11/17 with a cc of abdominal pain and decreased appetite. He describes lower abdominal pain, onset 3 weeks ago, that is constant, non-radiating and worse when wearing a belt. Associated symptoms include nausea, emesis, and decreased appetite. He describes a decrease in stool caliber and frequency over the past couple weeks, prior to this was having diarrhea. He denies similar symptoms in the past. Prior to this hospital admission he had never received a colonoscopy. In the ED the patient underwent CT scan which showed an obstructing sigmoid mass with enlarged sigmoid mesenteric lymph nodes, omental caking, and ascites. The patient was admitted and underwent colonoscopy 11/5 which showed a completely obstructing mass at the rectosigmoid junction, concerning for malignancy. Today the patient denies previous complications under anesthesia, denies drug allergies, denies PMH CVA or MI. He is a former smoker who quit >45 years ago. Denies alcohol or illicit drug use.   General surgery has been asked to see.   ROS: Review of Systems  Constitutional: Negative for chills, fever, malaise/fatigue and weight loss.  Cardiovascular: Negative for chest pain.  Gastrointestinal: Positive for abdominal pain, heartburn, nausea and vomiting. Negative for blood in stool.  All other systems reviewed and are negative.   Family History  Problem Relation Age of Onset  . Pancreatic cancer Mother   . Other Daughter        Precancerous lesions on colonoscopy  . Other Son        Precancerous lesion on colonoscopy    Past Medical History:  Diagnosis Date  . History of traumatic subdural hematoma    08-05-2005  --- pt fell off roof---  per imaging right posteroparietal-right parafalcine subdural hematoma w/ concussion --- per pt no residual  . Hypertension   . Nocturia   . Penile mass   . Phimosis   . Pre-diabetes   . Wears dentures    upper    Past Surgical History:  Procedure Laterality Date  . CIRCUMCISION N/A 01/03/2017   Procedure: CIRCUMCISION ADULT/ POSSIBLE PENILE BIOPSY;  Surgeon: Kathie Rhodes, MD;  Location: [facility];  Service: Urology;  Laterality: N/A;  . NO PAST SURGERIES      Social History:  reports that he quit smoking about 43 years ago. His smoking use included cigarettes. He quit after 5.00 years of use. He quit smokeless tobacco use about 38 years ago. His smokeless tobacco use included chew. He reports that he does not drink alcohol or use drugs.  Allergies: No Known Allergies  Medications Prior to Admission  Medication Sig Dispense Refill  . amLODipine (NORVASC) 5 MG tablet Take 2.5 mg by mouth every evening.    . benazepril (LOTENSIN) 20 MG tablet Take 10 mg by mouth every evening.    . cephALEXin (KEFLEX) 500 MG capsule Take 500 mg 4 (four) times daily by mouth.    Marland Kitchen CINNAMON PO Take by mouth.    . ciprofloxacin (CIPRO) 500 MG tablet Take 500 mg 2 (two) times daily by mouth.  0  . GARLIC PO Take by mouth.    Marland Kitchen ibuprofen (ADVIL,MOTRIN) 200 MG tablet Take 400 mg 2 (two) times daily as needed by mouth.    . metroNIDAZOLE (FLAGYL) 500 MG  tablet Take 500 mg 3 (three) times daily by mouth.  0  . oxycodone (OXY-IR) 5 MG capsule Take 1-2 capsules (5-10 mg total) every 4 (four) hours as needed by mouth. 10 capsule 0    Blood pressure 118/78, pulse 94, temperature 97.7 F (36.5 C), temperature source Oral, resp. rate (!) 23, height 6' (1.829 m), weight 84 kg (185 lb 3 oz), SpO2 94 %. Physical Exam: Physical Exam  Constitutional: He is oriented to person, place, and time. He appears well-developed.  Non-toxic appearance. He does not appear ill. No distress.  HENT:  Head:  Normocephalic and atraumatic.  Mouth/Throat: Oropharynx is clear and moist.  Eyes: EOM are normal. Pupils are equal, round, and reactive to light. No scleral icterus.  Cardiovascular: Normal rate, regular rhythm, normal heart sounds and intact distal pulses. Exam reveals no gallop.  Pulmonary/Chest: Effort normal and breath sounds normal. No stridor. No respiratory distress. He has no rhonchi. He has no rales. He exhibits no tenderness.  Abdominal: Soft. Bowel sounds are normal. He exhibits distension. He exhibits no pulsatile liver, no abdominal bruit, no pulsatile midline mass and no mass. There is tenderness (LLQ without peritonitis.).  Neurological: He is alert and oriented to person, place, and time.  Skin: Skin is warm and dry. Capillary refill takes less than 2 seconds. No rash noted. He is not diaphoretic. No erythema.  Psychiatric: He has a normal mood and affect. His behavior is normal.    Results for orders placed or performed during the hospital encounter of 01/11/17 (from the past 48 hour(s))  Urinalysis, Routine w reflex microscopic     Status: Abnormal   Collection Time: 01/11/17  4:00 PM  Result Value Ref Range   Color, Urine STRAW (A) YELLOW   APPearance CLEAR CLEAR   Specific Gravity, Urine 1.017 1.005 - 1.030   pH 7.0 5.0 - 8.0   Glucose, UA NEGATIVE NEGATIVE mg/dL   Hgb urine dipstick NEGATIVE NEGATIVE   Bilirubin Urine NEGATIVE NEGATIVE   Ketones, ur 5 (A) NEGATIVE mg/dL   Protein, ur NEGATIVE NEGATIVE mg/dL   Nitrite NEGATIVE NEGATIVE   Leukocytes, UA NEGATIVE NEGATIVE  Comprehensive metabolic panel     Status: Abnormal   Collection Time: 01/12/17  4:04 AM  Result Value Ref Range   Sodium 135 135 - 145 mmol/L   Potassium 3.9 3.5 - 5.1 mmol/L   Chloride 105 101 - 111 mmol/L   CO2 23 22 - 32 mmol/L   Glucose, Bld 119 (H) 65 - 99 mg/dL   BUN 10 6 - 20 mg/dL   Creatinine, Ser 0.87 0.61 - 1.24 mg/dL   Calcium 8.5 (L) 8.9 - 10.3 mg/dL   Total Protein 6.2 (L) 6.5 -  8.1 g/dL   Albumin 3.1 (L) 3.5 - 5.0 g/dL   AST 20 15 - 41 U/L   ALT 15 (L) 17 - 63 U/L   Alkaline Phosphatase 67 38 - 126 U/L   Total Bilirubin 0.6 0.3 - 1.2 mg/dL   GFR calc non Af Amer >60 >60 mL/min   GFR calc Af Amer >60 >60 mL/min    Comment: (NOTE) The eGFR has been calculated using the CKD EPI equation. This calculation has not been validated in all clinical situations. eGFR's persistently <60 mL/min signify possible Chronic Kidney Disease.    Anion gap 7 5 - 15  CBC     Status: Abnormal   Collection Time: 01/12/17  4:04 AM  Result Value Ref Range   WBC  6.9 4.0 - 10.5 K/uL   RBC 4.48 4.22 - 5.81 MIL/uL   Hemoglobin 12.8 (L) 13.0 - 17.0 g/dL   HCT 37.2 (L) 39.0 - 52.0 %   MCV 83.0 78.0 - 100.0 fL   MCH 28.6 26.0 - 34.0 pg   MCHC 34.4 30.0 - 36.0 g/dL   RDW 13.0 11.5 - 15.5 %   Platelets 359 150 - 400 K/uL   Ct Abdomen Pelvis W Contrast  Result Date: 01/11/2017 CLINICAL DATA:  Abdominal pain, cramping, bloating and diarrhea for 1 month. EXAM: CT ABDOMEN AND PELVIS WITH CONTRAST TECHNIQUE: Multidetector CT imaging of the abdomen and pelvis was performed using the standard protocol following bolus administration of intravenous contrast. CONTRAST:  158m ISOVUE-300 IOPAMIDOL (ISOVUE-300) INJECTION 61% COMPARISON:  None. FINDINGS: Lower chest: Small dependent left pleural effusion. Mild dependent left lower lobe atelectasis. Coronary atherosclerosis. Hepatobiliary: Normal liver with no liver mass. Normal gallbladder with no radiopaque cholelithiasis. No biliary ductal dilatation. Pancreas: Normal, with no mass or duct dilation. Spleen: Normal size. No mass. Adrenals/Urinary Tract: No discrete adrenal nodules. No hydronephrosis. Small simple parapelvic renal cysts in both kidneys. No suspicious renal masses. Normal bladder. Stomach/Bowel: Small hiatal hernia. Otherwise normal nondistended stomach. Normal caliber small bowel with no small bowel wall thickening. Normal appendix. There  is an irregular annular mass in the sigmoid colon measuring approximately 6.2 x 3.6 cm (series 2/ image 79). There is moderate left colonic diverticulosis. No significant large bowel dilatation. No additional sites of large bowel wall thickening. Vascular/Lymphatic: Atherosclerotic nonaneurysmal abdominal aorta. Patent portal, splenic, hepatic and renal veins. There are several clustered mildly enlarged lymph nodes in the sigmoid mesentery measuring up to 1.4 cm (series 2/ image 75). Reproductive: Top-normal size prostate. Other: There is extensive soft tissue caking throughout the omentum. Moderate volume ascites. Smooth peritoneal thickening and enhancement throughout the peritoneal cavity. Musculoskeletal: No aggressive appearing focal osseous lesions. Marked thoracolumbar spondylosis. IMPRESSION: 1. Irregular annular 6.2 x 3.6 cm mass in the sigmoid colon suspicious for primary colonic malignancy. 2. Multiple mildly enlarged lymph nodes in the sigmoid mesenteries suspicious for locoregional nodal metastases. 3. Extensive soft tissue caking throughout the omentum. Smooth peritoneal thickening and enhancement throughout the peritoneal cavity. Moderate volume ascites. 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 4. Small dependent left pleural effusion. 5.  Aortic Atherosclerosis (ICD10-I70.0).  Coronary atherosclerosis. Electronically Signed   By: JIlona SorrelM.D.   On: 01/11/2017 16:31   Assessment/Plan SCC penis HTN Poor PO intake Nausea  Rectosigmoid colon mass  - CT 11/3 w/ 6.2 x 3.6 cm obstructive sigmoid mass; enlarged mesenteric nodes, omental caking, peritoneal thickening, ascites. All concerning for potential primary colon malignancy with metastatic disease. - Flex Sig 11/15 significant for completely obstructing mass and rectosigmoid jxn. Follow path. - NPO after MN - OR tomorrow for laparoscopic partial colectomy and end colostomy.  - IV abx on call to OR   FEN:  clear liquid diet, NPO after MN ID: [email or web address] 11/16 On Call VTE: SCD's Foley: none     EJill Alexanders [facility] 01/13/2017, 2:01 PM Pager: [phone] Consults: [phone]-Fri 7:00 am-4:30 pm Sat-Sun 7:00 am-11:30 am

## 2017-01-13 NOTE — Op Note (Signed)
[facility] Patient Name: Thomas Wagner Procedure Date: 01/13/2017 MRN: [id number] Attending MD: Estill Cotta. Danis , MD Date of Birth: [birth year] CSN: [id number] Age: 76 Admit Type: Outpatient Procedure:                Flexible Sigmoidoscopy Indications:              Abdominal pain in the left lower quadrant, Abnormal                            CT of the GI tract, Colonic obstruction                            (obstructing sigmoid mass with omental spread and                            ascites seen on CT scan) Providers:                Estill Cotta. Loletha Carrow, MD, Elna Breslow, RN, Cletis [place],                            Technician Referring MD:              Medicines:                Midazolam 4 mg IV Complications:            No immediate complications. Estimated Blood Loss:     Estimated blood loss was minimal. Procedure:                Pre-Anesthesia Assessment:                           - Prior to the procedure, a History and Physical                            was performed, and patient medications and                            allergies were reviewed. The patient's tolerance of                            previous anesthesia was also reviewed. The risks                            and benefits of the procedure and the sedation                            options and risks were discussed with the patient.                            All questions were answered, and informed consent                            was obtained. Prior Anticoagulants: The patient has  taken no previous anticoagulant or antiplatelet                            agents. ASA Grade Assessment: III - A patient with                            severe systemic disease. After reviewing the risks                            and benefits, the patient was deemed in                            satisfactory condition to undergo the procedure.                           After obtaining informed  consent, the scope was                            passed under direct vision. The EC-[id number] ([id number])                            scope was introduced through the anus and advanced                            to the the sigmoid colon. The flexible                            sigmoidoscopy was accomplished without difficulty.                            The patient tolerated the procedure well. The                            quality of the bowel preparation was good. Scope In: Scope Out: Findings:      The perianal and digital rectal examinations were normal.      A fungating, infiltrative and ulcerated completely obstructing mass was       found in the recto-sigmoid colon (about 20 cm from anal verge). The mass       was circumferential. Mucosa was biopsied with a cold forceps for       histology. One specimen bottle was sent to pathology.      The exam was otherwise without abnormality. Impression:               - Likely malignant completely obstructing tumor in                            the recto-sigmoid colon. Biopsied.                           - The examination was otherwise normal. Moderate Sedation:      Moderate (conscious) sedation was administered by the endoscopy nurse       and supervised by the endoscopist. The following parameters were       monitored: oxygen saturation, heart rate, blood  pressure, respiratory       rate, EKG, adequacy of pulmonary ventilation, and response to care.       Total physician intraservice time was 15 minutes. Recommendation:           - Clear liquid diet.                           - Oncology consult to see if palliative treatment                            options.                           Surgical consult to consider palliative diverting                            colostomy (the mass is not amenable to stent                            placement without visible lumen). Procedure Code(s):        --- Professional ---                            [phone], Sigmoidoscopy, flexible; diagnostic,                            including collection of specimen(s) by brushing or                            washing, when performed (separate procedure)                           99152, Moderate sedation services provided by the                            same physician or other qualified health care                            professional performing the diagnostic or                            therapeutic service that the sedation supports,                            requiring the presence of an independent trained                            observer to assist in the monitoring of the                            patient's level of consciousness and physiological                            status; initial 15 minutes of intraservice time,  patient age 12 years or older Diagnosis Code(s):        --- Professional ---                           D49.0, Neoplasm of unspecified behavior of                            digestive system                           R10.32, Left lower quadrant pain                           K56.60, Unspecified intestinal obstruction                           R93.3, Abnormal findings on diagnostic imaging of                            other parts of digestive tract CPT copyright 2016 American Medical Association. All rights reserved. The codes documented in this report are preliminary and upon coder review may  be revised to meet current compliance requirements. Aydien Majette L. Loletha Carrow, MD 01/13/2017 9:36:46 AM This report has been signed electronically. Number of Addenda: 0

## 2017-01-13 NOTE — Progress Notes (Signed)
Tap water enema given with little results - mostly clear with a small amount of brown liquid stool). Continue to monitor.

## 2017-01-13 NOTE — Interval H&P Note (Signed)
History and Physical Interval Note:  01/13/2017 9:03 AM  Thomas Wagner  has presented today for surgery, with the diagnosis of Mass in sigmoid colon.  Obstructive symptoms with postprandial abdominal pain and early satiety  The various methods of treatment have been discussed with the patient and family. After consideration of risks, benefits and other options for treatment, the patient has consented to  Procedure(s): FLEXIBLE SIGMOIDOSCOPY (N/A) as a surgical intervention .  The patient's history has been reviewed, patient examined, no change in status, stable for surgery.  I have reviewed the patient's chart and labs.  Questions were answered to the patient's satisfaction.     Nelida Meuse III

## 2017-01-13 NOTE — Consult Note (Signed)
[place] Nurse requested for preoperative stoma site marking  Discussed surgical procedure and stoma creation with patient and family.  Explained role of the [place] nurse team.  Provided the patient with educational booklet and provided samples of pouching options.  Answered patient and family questions.   Examined patient lying and sitting in order to place the marking in the patient's visual field, away from any creases or abdominal contour issues and within the rectus muscle.  Attempted to mark below the patient's belt line. Pt's abd is very tight at this time and folds may evolve later.  Marked for colostomy in the LLQ  __4__ cm to the left of the umbilicus and _2___ES below the umbilicus.  Patient's abdomen cleansed with CHG wipes at site markings, allowed to air dry prior to marking.Pt plans for surgery tomorrow.  [place] Nurse team will follow up with patient after surgery for continue ostomy care and teaching.  Julien Girt MSN, RN, [place], [place], [place]

## 2017-01-14 ENCOUNTER — Encounter (HOSPITAL_COMMUNITY)
Admission: EM | Disposition: A | Discharge status: Home Health | Payer: Self-pay | Source: Home / Self Care | Attending: Internal Medicine

## 2017-01-14 ENCOUNTER — Inpatient Hospital Stay (HOSPITAL_COMMUNITY): Payer: Medicare Other | Admitting: Certified Registered"

## 2017-01-14 ENCOUNTER — Encounter (HOSPITAL_COMMUNITY): Payer: Self-pay | Admitting: Certified Registered"

## 2017-01-14 DIAGNOSIS — K6389 Other specified diseases of intestine: Secondary | ICD-10-CM

## 2017-01-14 DIAGNOSIS — E871 Hypo-osmolality and hyponatremia: Secondary | ICD-10-CM

## 2017-01-14 HISTORY — PX: COLON RESECTION: SHX5231

## 2017-01-14 LAB — TYPE AND SCREEN
ABO/RH(D): O POS
Antibody Screen: NEGATIVE

## 2017-01-14 LAB — ABO/RH: ABO/RH(D): O POS

## 2017-01-14 SURGERY — LAPAROSCOPIC SIGMOID COLON RESECTION
Anesthesia: General

## 2017-01-14 MED ORDER — SUGAMMADEX SODIUM 200 MG/2ML IV SOLN
INTRAVENOUS | Status: AC
  Filled 2017-01-14: qty 2

## 2017-01-14 MED ORDER — STERILE WATER FOR IRRIGATION IR SOLN
Status: DC | PRN
  Administered 2017-01-14: 2000 mL

## 2017-01-14 MED ORDER — LIP MEDEX EX OINT
TOPICAL_OINTMENT | CUTANEOUS | Status: AC
  Administered 2017-01-14: 22:00:00
  Filled 2017-01-14: qty 7

## 2017-01-14 MED ORDER — LACTATED RINGERS IV SOLN
INTRAVENOUS | Status: DC
  Administered 2017-01-14 (×3): via INTRAVENOUS

## 2017-01-14 MED ORDER — MORPHINE SULFATE (PF) 4 MG/ML IV SOLN
2.0000 mg | INTRAVENOUS | Status: DC | PRN
  Administered 2017-01-14 (×2): 4 mg via INTRAVENOUS
  Filled 2017-01-14 (×2): qty 1

## 2017-01-14 MED ORDER — FENTANYL CITRATE (PF) 250 MCG/5ML IJ SOLN
INTRAMUSCULAR | Status: DC | PRN
Start: 2017-01-14 — End: 2017-01-14
  Administered 2017-01-14 (×2): 50 ug via INTRAVENOUS
  Administered 2017-01-14: 25 ug via INTRAVENOUS
  Administered 2017-01-14 (×2): 50 ug via INTRAVENOUS
  Administered 2017-01-14: 25 ug via INTRAVENOUS

## 2017-01-14 MED ORDER — ONDANSETRON HCL 4 MG/2ML IJ SOLN
INTRAMUSCULAR | Status: AC
  Filled 2017-01-14: qty 2

## 2017-01-14 MED ORDER — ACETAMINOPHEN 500 MG PO TABS
1000.0000 mg | ORAL_TABLET | Freq: Three times a day (TID) | ORAL | Status: DC
  Administered 2017-01-14 – 2017-01-15 (×5): 1000 mg via ORAL
  Filled 2017-01-14 (×8): qty 2

## 2017-01-14 MED ORDER — BUPIVACAINE-EPINEPHRINE (PF) 0.25% -1:200000 IJ SOLN
INTRAMUSCULAR | Status: AC
  Filled 2017-01-14: qty 30

## 2017-01-14 MED ORDER — EPHEDRINE 5 MG/ML INJ
INTRAVENOUS | Status: AC
  Filled 2017-01-14: qty 10

## 2017-01-14 MED ORDER — PHENYLEPHRINE 40 MCG/ML (10ML) SYRINGE FOR IV PUSH (FOR BLOOD PRESSURE SUPPORT)
PREFILLED_SYRINGE | INTRAVENOUS | Status: AC
  Filled 2017-01-14: qty 20

## 2017-01-14 MED ORDER — OXYCODONE HCL 5 MG/5ML PO SOLN
5.0000 mg | Freq: Once | ORAL | Status: DC | PRN
  Filled 2017-01-14: qty 5

## 2017-01-14 MED ORDER — EPHEDRINE SULFATE-NACL 50-0.9 MG/10ML-% IV SOSY
PREFILLED_SYRINGE | INTRAVENOUS | Status: DC | PRN
  Administered 2017-01-14: 10 mg via INTRAVENOUS

## 2017-01-14 MED ORDER — ROCURONIUM BROMIDE 10 MG/ML (PF) SYRINGE
PREFILLED_SYRINGE | INTRAVENOUS | Status: DC | PRN
  Administered 2017-01-14: 50 mg via INTRAVENOUS
  Administered 2017-01-14: 10 mg via INTRAVENOUS
  Administered 2017-01-14: 5 mg via INTRAVENOUS

## 2017-01-14 MED ORDER — LIDOCAINE 2% (20 MG/ML) 5 ML SYRINGE
INTRAMUSCULAR | Status: DC | PRN
  Administered 2017-01-14: 40 mg via INTRAVENOUS
  Administered 2017-01-14: 60 mg via INTRAVENOUS

## 2017-01-14 MED ORDER — PHENYLEPHRINE 40 MCG/ML (10ML) SYRINGE FOR IV PUSH (FOR BLOOD PRESSURE SUPPORT)
PREFILLED_SYRINGE | INTRAVENOUS | Status: DC | PRN
  Administered 2017-01-14: 120 ug via INTRAVENOUS
  Administered 2017-01-14: 80 ug via INTRAVENOUS
  Administered 2017-01-14 (×3): 120 ug via INTRAVENOUS
  Administered 2017-01-14: 80 ug via INTRAVENOUS

## 2017-01-14 MED ORDER — LIDOCAINE 2% (20 MG/ML) 5 ML SYRINGE
INTRAMUSCULAR | Status: AC
  Filled 2017-01-14: qty 10

## 2017-01-14 MED ORDER — PROMETHAZINE HCL 25 MG/ML IJ SOLN: 6.2500 mg | INTRAMUSCULAR | Status: DC | PRN

## 2017-01-14 MED ORDER — SUGAMMADEX SODIUM 200 MG/2ML IV SOLN
INTRAVENOUS | Status: DC | PRN
  Administered 2017-01-14: 175 mg via INTRAVENOUS

## 2017-01-14 MED ORDER — MIDAZOLAM HCL 2 MG/2ML IJ SOLN
INTRAMUSCULAR | Status: AC
  Filled 2017-01-14: qty 2

## 2017-01-14 MED ORDER — DEXAMETHASONE SODIUM PHOSPHATE 10 MG/ML IJ SOLN
INTRAMUSCULAR | Status: AC
  Filled 2017-01-14: qty 1

## 2017-01-14 MED ORDER — CEFOTETAN DISODIUM-DEXTROSE 2-2.08 GM-%(50ML) IV SOLR
INTRAVENOUS | Status: AC
  Filled 2017-01-14: qty 50

## 2017-01-14 MED ORDER — PROPOFOL 10 MG/ML IV BOLUS
INTRAVENOUS | Status: AC
  Filled 2017-01-14: qty 20

## 2017-01-14 MED ORDER — ALBUMIN HUMAN 5 % IV SOLN
INTRAVENOUS | Status: DC | PRN
  Administered 2017-01-14: 12:00:00 via INTRAVENOUS

## 2017-01-14 MED ORDER — FENTANYL CITRATE (PF) 250 MCG/5ML IJ SOLN
INTRAMUSCULAR | Status: AC
  Filled 2017-01-14: qty 5

## 2017-01-14 MED ORDER — DEXAMETHASONE SODIUM PHOSPHATE 10 MG/ML IJ SOLN
INTRAMUSCULAR | Status: DC | PRN
  Administered 2017-01-14: 10 mg via INTRAVENOUS

## 2017-01-14 MED ORDER — HYDROMORPHONE HCL 1 MG/ML IJ SOLN
INTRAMUSCULAR | Status: AC
  Filled 2017-01-14: qty 1

## 2017-01-14 MED ORDER — OXYCODONE HCL 5 MG PO TABS: 5.0000 mg | ORAL_TABLET | Freq: Once | ORAL | Status: DC | PRN

## 2017-01-14 MED ORDER — PROPOFOL 10 MG/ML IV BOLUS
INTRAVENOUS | Status: DC | PRN
  Administered 2017-01-14: 50 mg via INTRAVENOUS
  Administered 2017-01-14: 100 mg via INTRAVENOUS

## 2017-01-14 MED ORDER — ALBUMIN HUMAN 5 % IV SOLN
INTRAVENOUS | Status: AC
  Filled 2017-01-14: qty 250

## 2017-01-14 MED ORDER — MIDAZOLAM HCL 2 MG/2ML IJ SOLN
INTRAMUSCULAR | Status: DC | PRN
  Administered 2017-01-14 (×2): 1 mg via INTRAVENOUS

## 2017-01-14 MED ORDER — BUPIVACAINE-EPINEPHRINE 0.25% -1:200000 IJ SOLN
INTRAMUSCULAR | Status: DC | PRN
  Administered 2017-01-14: 18 mL

## 2017-01-14 MED ORDER — ONDANSETRON HCL 4 MG/2ML IJ SOLN
INTRAMUSCULAR | Status: DC | PRN
  Administered 2017-01-14: 4 mg via INTRAVENOUS

## 2017-01-14 MED ORDER — HYDROMORPHONE HCL 1 MG/ML IJ SOLN
0.2500 mg | INTRAMUSCULAR | Status: DC | PRN
  Administered 2017-01-14: 0.5 mg via INTRAVENOUS

## 2017-01-14 SURGICAL SUPPLY — 50 items
ADH SKN CLS APL DERMABOND .7 (GAUZE/BANDAGES/DRESSINGS) ×1
BLADE EXTENDED COATED 6.5IN (ELECTRODE) ×2 IMPLANT
CABLE HIGH FREQUENCY MONO STRZ (ELECTRODE) ×3 IMPLANT
CHLORAPREP W/TINT 26ML (MISCELLANEOUS) ×3 IMPLANT
DECANTER SPIKE VIAL GLASS SM (MISCELLANEOUS) ×3 IMPLANT
DERMABOND ADVANCED (GAUZE/BANDAGES/DRESSINGS) ×2
DERMABOND ADVANCED .7 DNX12 (GAUZE/BANDAGES/DRESSINGS) ×1 IMPLANT
DRAPE LAPAROSCOPIC ABDOMINAL (DRAPES) ×3 IMPLANT
DRAPE SURG IRRIG POUCH 19X23 (DRAPES) ×3 IMPLANT
DRSG OPSITE POSTOP 4X6 (GAUZE/BANDAGES/DRESSINGS) ×2 IMPLANT
ELECT PENCIL ROCKER SW 15FT (MISCELLANEOUS) ×6 IMPLANT
ELECT REM PT RETURN 15FT ADLT (MISCELLANEOUS) ×3 IMPLANT
GLOVE BIO SURGEON STRL SZ 6.5 (GLOVE) ×4 IMPLANT
GLOVE BIO SURGEONS STRL SZ 6.5 (GLOVE) ×2
GLOVE BIOGEL PI IND STRL 7.0 (GLOVE) ×2 IMPLANT
GLOVE BIOGEL PI INDICATOR 7.0 (GLOVE) ×4
GOWN STRL REUS W/TWL 2XL LVL3 (GOWN DISPOSABLE) ×6 IMPLANT
GOWN STRL REUS W/TWL XL LVL3 (GOWN DISPOSABLE) ×12 IMPLANT
GRASPER ENDOPATH ANVIL 10MM (MISCELLANEOUS) IMPLANT
HOLDER FOLEY CATH W/STRAP (MISCELLANEOUS) ×3 IMPLANT
IRRIG SUCT STRYKERFLOW 2 WTIP (MISCELLANEOUS) ×3
IRRIGATION SUCT STRKRFLW 2 WTP (MISCELLANEOUS) ×1 IMPLANT
LUBRICANT JELLY K Y 4OZ (MISCELLANEOUS) ×3 IMPLANT
PACK COLON (CUSTOM PROCEDURE TRAY) ×3 IMPLANT
PAD POSITIONING PINK XL (MISCELLANEOUS) ×3 IMPLANT
PORT LAP GEL ALEXIS MED 5-9CM (MISCELLANEOUS) ×3 IMPLANT
POSITIONER SURGICAL ARM (MISCELLANEOUS) ×3 IMPLANT
SCISSORS LAP 5X35 DISP (ENDOMECHANICALS) ×3 IMPLANT
SEALER TISSUE X1 CVD JAW (INSTRUMENTS) IMPLANT
SLEEVE XCEL OPT CAN 5 100 (ENDOMECHANICALS) ×3 IMPLANT
SPONGE LAP 18X18 X RAY DECT (DISPOSABLE) ×2 IMPLANT
STAPLER CUT CVD 40MM GREEN (STAPLE) ×2 IMPLANT
STAPLER CUT RELOAD BLUE (STAPLE) ×2 IMPLANT
SUT NOVA NAB GS-21 0 18 T12 DT (SUTURE) ×6 IMPLANT
SUT SILK 2 0 (SUTURE) ×3
SUT SILK 2 0 SH CR/8 (SUTURE) ×2 IMPLANT
SUT SILK 2-0 18XBRD TIE 12 (SUTURE) IMPLANT
SUT SILK 3 0 (SUTURE) ×3
SUT SILK 3 0 SH CR/8 (SUTURE) ×2 IMPLANT
SUT SILK 3-0 18XBRD TIE 12 (SUTURE) IMPLANT
SUT VIC AB 2-0 SH 18 (SUTURE) ×7 IMPLANT
SUT VIC AB 4-0 PS2 27 (SUTURE) ×5 IMPLANT
SYS LAPSCP GELPORT 120MM (MISCELLANEOUS) ×3
SYSTEM LAPSCP GELPORT 120MM (MISCELLANEOUS) IMPLANT
TOWEL OR NON WOVEN STRL DISP B (DISPOSABLE) ×3 IMPLANT
TROCAR BLADELESS OPT 5 100 (ENDOMECHANICALS) ×3 IMPLANT
TROCAR XCEL BLUNT TIP 100MML (ENDOMECHANICALS) ×2 IMPLANT
TUBING CONNECTING 10 (TUBING) ×2 IMPLANT
TUBING CONNECTING 10' (TUBING) ×1
TUBING INSUF HEATED (TUBING) ×3 IMPLANT

## 2017-01-14 NOTE — Progress Notes (Signed)
Metastatic disease ([place])  Subjective: No further questions about surgery.    Objective: Vital signs in last 24 hours: Temp:  [98 F (36.7 C)-98.7 F (37.1 C)] 98.7 F (37.1 C) (11/16 0528) Pulse Rate:  [82-84] 82 (11/16 0528) Resp:  [18-20] 18 (11/16 0528) BP: (132-146)/(90-96) 132/90 (11/16 0528) SpO2:  [93 %-97 %] 93 % (11/16 0528) Last BM Date: 01/13/17  Intake/Output from previous day: No intake/output data recorded. Intake/Output this shift: Total I/O In: -  Out: 1 [Urine:1]  General appearance: alert and cooperative GI: soft, distended  Lab Results:  Results for orders placed or performed during the hospital encounter of 01/11/17 (from the past 24 hour(s))  Surgical pcr screen     Status: None   Collection Time: 01/13/17  9:49 PM  Result Value Ref Range   MRSA, PCR NEGATIVE NEGATIVE   Staphylococcus aureus NEGATIVE NEGATIVE     Studies/Results Radiology     MEDS, Scheduled . [MAR Hold] amLODipine  5 mg Oral QPM  . 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      LOS: 2 days    Rosario Adie, MD [facility], [place]   01/14/2017 10:33 AM

## 2017-01-14 NOTE — Progress Notes (Signed)
  Oncology Nurse Navigator Documentation  Navigator Location: [facility] (01/14/17 0931) Referral date to RadOnc/MedOnc: 01/13/17 (01/14/17 1216) )Navigator Encounter Type: Other(placing med/onc referral) (01/14/17 0948)   Abnormal Finding Date: 01/11/17 (01/14/17 2446) Confirmed Diagnosis Date: 01/11/17 (01/14/17 9507) Surgery Date: 01/14/17 (01/14/17 2257)                 Barriers/Navigation Needs: Coordination of Care (01/14/17 5051)   Interventions: Referrals;Coordination of Care (01/14/17 8335) Referrals: Other(med/onc referral placed.) (01/14/17 8251) Coordination of Care: Appts (01/14/17 8984)   Referral placed and scheduling message sent requesting that patient be scheduled with Dr. Burr Medico in two weeks.     Acuity: Level 1 (01/14/17 0948)         Time Spent with Patient: 15 (01/14/17 0948)

## 2017-01-14 NOTE — Anesthesia Preprocedure Evaluation (Signed)
Anesthesia Evaluation  Patient identified by MRN, date of birth, ID band Patient awake    Reviewed: Allergy & Precautions, NPO status , Patient's Chart, lab work & pertinent test results  History of Anesthesia Complications Negative for: history of anesthetic complications  Airway Mallampati: I  TM Distance: >3 FB Neck ROM: Full    Dental  (+) Upper Dentures, Dental Advisory Given   Pulmonary former smoker,    Pulmonary exam normal        Cardiovascular hypertension, Pt. on medications Normal cardiovascular exam  ECG: NSR, rate 88   Neuro/Psych negative neurological ROS  negative psych ROS   GI/Hepatic Neg liver ROS, obstructing colon mass   Endo/Other  Pre-diabetes  Renal/GU negative Renal ROS     Musculoskeletal negative musculoskeletal ROS (+)   Abdominal   Peds  Hematology  (+) anemia ,   Anesthesia Other Findings   Reproductive/Obstetrics                             Anesthesia Physical  Anesthesia Plan  ASA: III  Anesthesia Plan: General   Post-op Pain Management:    Induction: Intravenous  PONV Risk Score and Plan: 2 and Ondansetron, Dexamethasone and Treatment may vary due to age or medical condition  Airway Management Planned: Oral ETT  Additional Equipment:   Intra-op Plan:   Post-operative Plan: Extubation in OR  Informed Consent: I have reviewed the patients History and Physical, chart, labs and discussed the procedure including the risks, benefits and alternatives for the proposed anesthesia with the patient or authorized representative who has indicated his/her understanding and acceptance.   Dental advisory given  Plan Discussed with: CRNA  Anesthesia Plan Comments:         Anesthesia Quick Evaluation

## 2017-01-14 NOTE — Op Note (Signed)
01/11/2017 - 01/14/2017  1:45 PM  PATIENT:  Thomas Wagner  76 y.o. male  Patient Care Team: [name], MD as PCP - General (Family Medicine)  PRE-OPERATIVE DIAGNOSIS:  obstructing colon mass  POST-OPERATIVE DIAGNOSIS:  obstructing sigmoid colon mass  PROCEDURE:  LAPAROSCOPIC SIGMOID RESECTION WITH END COLOSTOMY, OMENTAL BIOPSY   Surgeon(s): Leighton Ruff, MD  ASSISTANT: none   ANESTHESIA:   local and general  EBL: 113ml Total I/O In: 1250 [I.V.:1000; IV Piggyback:250] Out: 8295 [Urine:186; Other:3000; Blood:100]  Delay start of Pharmacological VTE agent (>24hrs) due to surgical blood loss or risk of bleeding:  no  DRAINS: none   SPECIMEN:  Source of Specimen:  Sigmoid colon, omentum  DISPOSITION OF SPECIMEN:  PATHOLOGY  COUNTS:  YES  PLAN OF CARE: Admit to inpatient   PATIENT DISPOSITION:  PACU - hemodynamically stable.  INDICATION:    76 y.o. M with obstructing sigmoid colon mass.   I recommended segmental resection:  The anatomy & physiology of the digestive tract was discussed.  The pathophysiology was discussed.  Natural history risks without surgery was discussed.   I worked to give an overview of the disease and the frequent need to have multispecialty involvement.  I feel the risks of no intervention will lead to serious problems that outweigh the operative risks; therefore, I recommended a partial colectomy to remove the pathology.  Laparoscopic & open techniques were discussed.   Risks such as bleeding, infection, abscess, leak, reoperation, possible ostomy, hernia, heart attack, death, and other risks were discussed.  I noted a good likelihood this will help address the problem.   Goals of post-operative recovery were discussed as well.    The patient expressed understanding & wished to proceed with surgery.  OR FINDINGS:   Patient had obvious metastatic disease throughout his abdomen.    DESCRIPTION:   Informed consent was confirmed.  The  patient underwent general anaesthesia without difficulty.  The patient was positioned appropriately.  VTE prevention in place.  The patient's abdomen was clipped, prepped, & draped in a sterile fashion.  Surgical timeout confirmed our plan.  The patient was positioned in reverse Trendelenburg.  Abdominal entry was gained using open lower midline cut down.  Entry was clean. An Alexa retractor was placed. I induced carbon dioxide insufflation.  Camera inspection revealed no injury.  Extra ports were carefully placed under direct laparoscopic visualization.  Upon entry into the abdomen we removed approximately 3 L of ascites.  A sample of this was taken for cytologic evaluation.  The omentum was caked with tumor nodules.  There were nodules surrounding the peritoneum.  There were nodules up the descending colon and left pelvic sidewall and descending colon to the level of the splenic flexure.  Nodules were noted along the hemidiaphragm on the left side as well.  There was minimal small bowel involvement.     I reflected the greater omentum and the upper abdomen the small bowel in the upper abdomen. I scored the base of peritoneum of the right side of the mesentery of the left colon from the ligament of Treitz to the peritoneal reflection of the mid rectum.   I elevated the sigmoid mesentery and enetered into the retro-mesenteric plane.  We were able to identify the left ureter and gonadal vessels. We kept those posterior within the retroperitoneum and elevated the left colon mesentery off that. I did isolated IMA pedicle but did not ligate it yet.  I continued distally and got into the avascular  plane posterior to the mesorectum. This allowed me to help mobilize the rectum as well by freeing the mesorectum off the sacrum.  I mobilized the peritoneal coverings towards the peritoneal reflection on both the right and left sides of the rectum.  I could see the right and left ureters and stayed away from them.    I  skeletonized the inferior mesenteric artery pedicle.  I went down to its takeoff from the aorta.   I isolated the inferior mesenteric vein off of the ligament of Treitz just cephalad to that as well.  After confirming the left ureter was out of the way, I went ahead and ligated the inferior mesenteric artery pedicle with bipolar EnSeal ~2cm above its takeoff from the aorta.   We ensured hemostasis. I skeletonized the mesorectum at the junction at the proximal rectum using blunt dissection & bipolar EnSeal.  I mobilized the left colon in a lateral to medial fashion off the line of Toldt up towards the splenic flexure to ensure good mobilization of the left colon to reach into the pelvis.  After the colon was mobilized I used a contour stapler to divide the rectosigmoid junction using a green load.  The remaining mesentery was divided with the Enseal device.  I then divided the proximal margin with a blue load of the contour stapler.  The specimen was then sent off the field.  A colostomy was created with the remaining colon.  This was brought out through the previously marked ostomy site.  I brought the omentum down and removed a portion of this for additional biopsy.  Hemostasis was good throughout the abdomen.  The patient continued to make quite a bit of ascites.  The Alexis wound protector was removed after the abdomen was irrigated with several liters of sterile water.  The peritoneum and posterior fascia was closed using a running 2-0 Vicryl suture.  The anterior fascia was closed using a running #1 Novafil suture x2.  The subcutaneous tissue was reapproximated using 2-0 Vicryl sutures and the skin was closed using a running 4-0 Vicryl subcuticular suture.  The port sites were also closed using a 4-0 Vicryl suture.  Dermabond was placed on the port sites.  The colostomy was then matured in standard Brooke fashion using 2-0 Vicryl sutures.  A sterile dressing and colostomy appliance were placed.  The patient  was then awakened from anesthesia and sent to the postanesthesia care unit in stable condition.  All counts were correct per operating room staff.  An PA assistant was necessary for tissue manipulation, retraction and positioning due to the complexity of the case and hospital policies

## 2017-01-14 NOTE — Anesthesia Procedure Notes (Signed)
Date/Time: 01/14/2017 1:52 PM Performed by: Cynda Familia, CRNA Pre-anesthesia Checklist: Suction available and Patient being monitored Oxygen Delivery Method: Simple face mask Placement Confirmation: positive ETCO2 and breath sounds checked- equal and bilateral Dental Injury: Teeth and Oropharynx as per pre-operative assessment  Comments: Extubated to face mask

## 2017-01-14 NOTE — Transfer of Care (Signed)
Immediate Anesthesia Transfer of Care Note  Patient: Thomas Wagner  Procedure(s) Performed: LAPAROSCOPIC SIGMOIDECTOMY , OMENTECTOMY BIOPSY WITH END COLOSTOMY (N/A )  Patient Location: PACU  Anesthesia Type:General  Level of Consciousness: sedated  Airway & Oxygen Therapy: Patient Spontanous Breathing and Patient connected to face mask oxygen  Post-op Assessment: Report given to RN and Post -op Vital signs reviewed and stable  Post vital signs: Reviewed and stable  Last Vitals:  Vitals:   01/14/17 0528 01/14/17 1403  BP: 132/90 122/71  Pulse: 82 93  Resp: 18 15  Temp: 37.1 C   SpO2: 93% 95%    Last Pain:  Vitals:   01/14/17 0528  TempSrc: Oral  PainSc:       Patients Stated Pain Goal: 0 (47/20/72 1828)  Complications: No apparent anesthesia complications

## 2017-01-14 NOTE — Progress Notes (Signed)
0830 called to room urgently. Pt"s wife and Pt were in the bathroom. Mrs Yono was standing behind Mr Thomas Wagner holding him up,He was bathing at then sink, felt weak and leaned against the wall. Pt was  then seated on to a BS commode  to recover from feeling faint. VS were BP 129/84 P 83 , R 18 , o2 sat 95 %. He stated he didn't"t remember  going into the BR. He was alert x 4 ,assisted to bed. Family are at the bedside and the bed alarm is on. Pt stated he understood to call for assistance to get OOB. Dr Sheran Fava was made aware

## 2017-01-14 NOTE — Progress Notes (Signed)
PROGRESS NOTE  Thomas Wagner  TDV:[id number] DOB: 1940-06-02 DOA: 01/11/2017 PCP: [name], MD  Brief Narrative:   RIGHT hand Dx, HTN, prediabetes, Penile Squamous Cell Carcinoma Dx 01/03/2014 (seen by Dr. Kathie Rhodes [facility]) resented with decreased appetite, nausea, abdominal pain times 2-3 weeks.  He was found to have an obstructing colorectal mass.  He underwent resection with colostomy placement and biopsies of his omentum on 11/16.   Assessment & Plan:   Principal Problem:   Metastatic disease ([place]) Active Problems:   Abnormal computed tomography of sigmoid colon   Essential hypertension   SCC (squamous cell carcinoma)   Hyponatremia   Mass of colon   Left lower quadrant pain  Squamous cell carcinoma penis -Diagnosed 11/5. Consult Dr. Kathie Rhodes [facility] after patient's colon cancer issues resolved. -Although, Dr. Kathie Rhodes Urology wanted patient to see oncology specialist in [facility], will not be stable for several weeks.   Sigmoid colon mass -Newly diagnosed sigmoid colon mass -Consulted Dr. Leighton Ruff surgery who performed colon resection on 11/16 -Dr. Jana Hakim Oncology will see patient after resection of colon mass. 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  Nausea, vomiting  Decreased PO:  -  Diet per surgery post procedure  Hyponatremia:  -Resolved .  Essential HTN -Amlodipine 5 mg daily -Lotensin 10 mg daily    DVT prophylaxis:  SCDs until surgery okay for chemical prophylaxis Code Status: Full code Family Communication: Patient alone Disposition Plan: Anticipate several days of recovery and hospital post surgery.  Will need to have PT evaluation likely post surgery.  Oncology to assist with decision-making regarding treatment options.   Consultants:   Dr. Marcello Moores, general surgery  Procedures:  Resection of colorectal mass with colostomy placement, removal of ascites and biopsy of  omental nodes  Antimicrobials:  Anti-infectives (From admission, onward)   Start     Dose/Rate Route Frequency Ordered Stop   01/14/17 1046  cefoTEtan in Dextrose 5% (CEFOTAN) 2-2.08 GM-%(50ML) IVPB    Comments:  Bridget Hartshorn   : cabinet override      01/14/17 1046 01/14/17 2259   01/14/17 0600  cefoTEtan (CEFOTAN) 2 g in dextrose 5 % 50 mL IVPB  Status:  Discontinued     2 g 100 mL/hr over 30 Minutes Intravenous On call to O.R. 01/13/17 1427 01/13/17 1437   01/14/17 0600  cefoTEtan (CEFOTAN) 2 g in dextrose 5 % 50 mL IVPB  Status:  Discontinued     2 g 100 mL/hr over 30 Minutes Intravenous On call to O.R. 01/13/17 1438 01/13/17 1438   01/14/17 0600  cefoTEtan (CEFOTAN) 2 g in dextrose 5 % 50 mL IVPB     2 g 100 mL/hr over 30 Minutes Intravenous On call to O.R. 01/13/17 1439 01/14/17 1129       Subjective: Patient states he continues to have some left lower quadrant discomfort and nausea but he is anxious to have the surgery.  He feels hungry.  He denies chest pains or difficulty breathing.  He almost fainted after standing for prolonged period of time in the bathroom.  Objective: Vitals:   01/14/17 1500 01/14/17 1515 01/14/17 1615 01/14/17 1729  BP: 117/75 124/82 113/67 121/70  Pulse: 95 (!) 102 94 95  Resp: 13 14    Temp: 98.4 F (36.9 C) 98.2 F (36.8 C) 98.4 F (36.9 C) 98.3 F (36.8 C)  TempSrc:      SpO2: 94% 96% 95% 96%  Weight:      Height:        Intake/Output Summary (Last 24 hours) at 01/14/2017 1823 Last data filed at 01/14/2017 1800 Gross per 24 hour  Intake 2230 ml  Output 3586 ml  Net -1356 ml   Filed Weights   01/11/17 1241 01/11/17 1959  Weight: 81.6 kg (180 lb) 84 kg (185 lb 3 oz)    Examination:  General exam:  Adult male.  No acute distress.  HEENT:  NCAT, MMM Respiratory system: Clear to auscultation bilaterally Cardiovascular system: Regular rate and rhythm, normal S1/S2. No murmurs, rubs, gallops or clicks.  Warm  extremities Gastrointestinal system: Normal active bowel sounds, soft, mildly distended, tender to palpation in the left lower quadrant and along the left side of the abdominal wall MSK:  Normal tone and bulk, no lower extremity edema Neuro:  Grossly intact    Data Reviewed: I have personally reviewed following labs and imaging studies  CBC: Recent Labs  Lab 01/11/17 1359 01/12/17 0404  WBC 10.2 6.9  HGB 14.0 12.8*  HCT 39.2 37.2*  MCV 82.2 83.0  PLT 382 244   Basic Metabolic Panel: Recent Labs  Lab 01/11/17 1359 01/12/17 0404  NA 129* 135  K 4.0 3.9  CL 96* 105  CO2 22 23  GLUCOSE 117* 119*  BUN 12 10  CREATININE 0.99 0.87  CALCIUM 8.7* 8.5*   GFR: Estimated Creatinine Clearance: 79.3 mL/min (by C-G formula based on SCr of 0.87 mg/dL). Liver Function Tests: Recent Labs  Lab 01/11/17 1359 01/12/17 0404  AST 26 20  ALT 18 15*  ALKPHOS 77 67  BILITOT 0.5 0.6  PROT 7.0 6.2*  ALBUMIN 3.3* 3.1*   Recent Labs  Lab 01/11/17 1359  LIPASE 23   No results for input(s): AMMONIA in the last 168 hours. Coagulation Profile: No results for input(s): INR, PROTIME in the last 168 hours. Cardiac Enzymes: No results for input(s): CKTOTAL, CKMB, CKMBINDEX, TROPONINI in the last 168 hours. BNP (last 3 results) No results for input(s): PROBNP in the last 8760 hours. HbA1C: No results for input(s): HGBA1C in the last 72 hours. CBG: No results for input(s): GLUCAP in the last 168 hours. Lipid Profile: No results for input(s): CHOL, HDL, LDLCALC, TRIG, CHOLHDL, LDLDIRECT in the last 72 hours. Thyroid Function Tests: No results for input(s): TSH, T4TOTAL, FREET4, T3FREE, THYROIDAB in the last 72 hours. Anemia Panel: No results for input(s): VITAMINB12, FOLATE, FERRITIN, TIBC, IRON, RETICCTPCT in the last 72 hours. Urine analysis:    Component Value Date/Time   COLORURINE STRAW (A) 01/11/2017 1600   APPEARANCEUR CLEAR 01/11/2017 1600   LABSPEC 1.017 01/11/2017 1600    PHURINE 7.0 01/11/2017 1600   GLUCOSEU NEGATIVE 01/11/2017 1600   HGBUR NEGATIVE 01/11/2017 1600   BILIRUBINUR NEGATIVE 01/11/2017 1600   KETONESUR 5 (A) 01/11/2017 1600   PROTEINUR NEGATIVE 01/11/2017 1600   NITRITE NEGATIVE 01/11/2017 1600   LEUKOCYTESUR NEGATIVE 01/11/2017 1600   Sepsis Labs: @LABRCNTIP (procalcitonin:4,lacticidven:4)  ) Recent Results (from the past 240 hour(s))  Surgical pcr screen     Status: None   Collection Time: 01/13/17  9:49 PM  Result Value Ref Range Status   MRSA, PCR NEGATIVE NEGATIVE Final   Staphylococcus aureus NEGATIVE NEGATIVE Final    Comment: (NOTE) The Xpert SA Assay (FDA approved for NASAL specimens in patients 14 years of age and older), is one component of a comprehensive surveillance program. It is not intended to diagnose infection nor to guide or monitor treatment.  Radiology Studies: No results found.   Scheduled Meds: . acetaminophen  1,000 mg Oral Q8H  . amLODipine  5 mg Oral QPM  . benazepril  10 mg Oral QPM  . cefoTEtan in Dextrose 5%      . HYDROmorphone       Continuous Infusions: . dextrose 5 % and 0.9% NaCl 100 mL/hr at 01/14/17 1750     LOS: 2 days    Time spent: 30 min    Janece Canterbury, MD [facility] Pager [phone]  If 7PM-7AM, please contact night-coverage [email or web address] Password [facility] 01/14/2017, 6:23 PM

## 2017-01-14 NOTE — Anesthesia Procedure Notes (Signed)
Procedure Name: Intubation Date/Time: 01/14/2017 11:26 AM Performed by: Cynda Familia, CRNA Pre-anesthesia Checklist: Patient identified, Emergency Drugs available, Suction available and Patient being monitored Patient Re-evaluated:Patient Re-evaluated prior to induction Oxygen Delivery Method: Circle System Utilized Preoxygenation: Pre-oxygenation with 100% oxygen Induction Type: IV induction Ventilation: Mask ventilation without difficulty Laryngoscope Size: Miller and 2 Grade View: Grade I Tube type: Oral Number of attempts: 1 Airway Equipment and Method: Stylet Placement Confirmation: ETT inserted through vocal cords under direct vision,  positive ETCO2 and breath sounds checked- equal and bilateral Secured at: 22 cm Tube secured with: Tape Dental Injury: Teeth and Oropharynx as per pre-operative assessment  Comments: Smooth IV induction Ellender present-- intubation AM CRNA atraumatic--- teeth and mouth as preop--- bilat BS Ellender

## 2017-01-15 ENCOUNTER — Inpatient Hospital Stay (HOSPITAL_COMMUNITY): Payer: Medicare Other

## 2017-01-15 ENCOUNTER — Encounter (HOSPITAL_COMMUNITY): Payer: Self-pay | Admitting: General Surgery

## 2017-01-15 LAB — CBC
HEMATOCRIT: 34.7 % — AB (ref 39.0–52.0)
Hemoglobin: 11.8 g/dL — ABNORMAL LOW (ref 13.0–17.0)
MCH: 28.5 pg (ref 26.0–34.0)
MCHC: 34 g/dL (ref 30.0–36.0)
MCV: 83.8 fL (ref 78.0–100.0)
Platelets: 337 10*3/uL (ref 150–400)
RBC: 4.14 MIL/uL — ABNORMAL LOW (ref 4.22–5.81)
RDW: 13 % (ref 11.5–15.5)
WBC: 14.7 10*3/uL — ABNORMAL HIGH (ref 4.0–10.5)

## 2017-01-15 LAB — BASIC METABOLIC PANEL
Anion gap: 5 (ref 5–15)
BUN: 6 mg/dL (ref 6–20)
CALCIUM: 8.1 mg/dL — AB (ref 8.9–10.3)
CO2: 27 mmol/L (ref 22–32)
CREATININE: 0.94 mg/dL (ref 0.61–1.24)
Chloride: 104 mmol/L (ref 101–111)
GFR calc Af Amer: >60 mL/min (ref >60)
GFR calc non Af Amer: >60 mL/min (ref >60)
GLUCOSE: 153 mg/dL — AB (ref 65–99)
Potassium: 4 mmol/L (ref 3.5–5.1)
Sodium: 136 mmol/L (ref 135–145)

## 2017-01-15 LAB — CEA: CEA: 3.2 ng/mL (ref 0.0–4.7)

## 2017-01-15 MED ORDER — ENOXAPARIN SODIUM 40 MG/0.4ML ~~LOC~~ SOLN
40.0000 mg | SUBCUTANEOUS | Status: DC
  Administered 2017-01-15 – 2017-01-18 (×4): 40 mg via SUBCUTANEOUS
  Filled 2017-01-15 (×4): qty 0.4

## 2017-01-15 MED ORDER — IOPAMIDOL (ISOVUE-300) INJECTION 61%
INTRAVENOUS | Status: AC
  Administered 2017-01-15: 75 mL via INTRAVENOUS
  Filled 2017-01-15: qty 75

## 2017-01-15 NOTE — Progress Notes (Addendum)
PROGRESS NOTE  Thomas Wagner  EXB:[id number] DOB: [birth year] DOA: 01/11/2017 PCP: [name], MD  Brief Narrative:    RIGHT hand Dx, HTN, prediabetes, Penile Squamous Cell Carcinoma Dx 01/03/2014 (seen byDr. Kathie Rhodes Urology) resented with decreased appetite, nausea, abdominal pain times 2-3 weeks.  He was found to have an obstructing colorectal mass.  He underwent resection with colostomy placement and biopsies of his omentum on 11/16.   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  -  Limited prognosis given signet ring cells and metastatic disease - Dr. Leighton Ruff performed colon resection on 11/16 -  CEA 3.2 -  CT chest with IV contrast ordered -  Would appreciate PORT placement prior to discharge, but could be done as outpatient if needed -  Patient to follow up with Oncology, Dr. Burr Medico in 2 weeks after discahrge  Squamous cell carcinoma penis -Diagnosed 11/5.ConsultDr. Kathie Rhodes Urologyafter patient's colon cancer issues resolved. -Although, Dr. Kathie Rhodes Urologywanted patient to see oncology specialist in [facility], will not be stable for several weeks.   Nausea, vomiting due to obstruction, resolved.   -  Advanced to full liquid diet today  Hyponatremia:  -Resolved  Essential HTN, BP stable -Amlodipine 5 mgdaily -Lotensin 10 mgdaily   Leukocytosis, likely reactive from surgery, no signs of infection - repeat in AM  Mild normocytic anemia likely due to cancer and recent surgery.   -  Repeat hgb in AM  DVT prophylaxis:  start lovenox Code Status: Full code Family Communication: Patient and extended family at  bedside Disposition Plan:  Ambulating in halls, advancing diet.  May get port placed prior to discharge.    Consultants:   General Surgery  Oncology  Gastroenterology, [place], Dr. Loletha Carrow  Procedures:   11/15 Sigmoidoscopy:- Likely malignant completely obstructing tumor in the recto-sigmoid colon. Biopsied.  11/16 Resection of colorectal mass with colostomy placement, removal of ascites and biopsy of omental nodules, LN resection    Antimicrobials:  Anti-infectives (From admission, onward)   Start     Dose/Rate Route Frequency Ordered Stop   01/14/17 1046  cefoTEtan in Dextrose 5% (CEFOTAN) 2-2.08 GM-%(50ML) IVPB    Comments:  Bridget Hartshorn   : cabinet override      01/14/17 1046 01/14/17 2259   01/14/17 0600  cefoTEtan (CEFOTAN) 2 g in dextrose 5 % 50 mL IVPB  Status:  Discontinued     2 g 100 mL/hr over 30 Minutes Intravenous On call to O.R. 01/13/17 1427 01/13/17 1437   01/14/17 0600  cefoTEtan (CEFOTAN) 2 g in dextrose 5 % 50 mL IVPB  Status:  Discontinued     2 g 100 mL/hr over 30 Minutes Intravenous On call to O.R. 01/13/17 1438 01/13/17 1438   01/14/17 0600  cefoTEtan (CEFOTAN) 2 g in dextrose 5 % 50 mL IVPB     2 g 100 mL/hr over 30 Minutes Intravenous On call to O.R. 01/13/17 1439 01/14/17 1129       Subjective: Feels well.  Having minimal pain.  Ambulating in halls.  Tolerated clear liquids.  Minimal ostomy output so far.    Objective: Vitals:   01/14/17 1729 01/14/17 2112 01/15/17 0216 01/15/17  0559  BP: 121/70 120/77 123/75 113/66  Pulse: 95 95 85 77  Resp:  16 18 18   Temp: 98.3 F (36.8 C) 98.5 F (36.9 C) 97.9 F (36.6 C) 98.4 F (36.9 C)  TempSrc:  Oral Oral Oral  SpO2: 96% 97% 98% 98%  Weight:      Height:        Intake/Output Summary (Last 24 hours) at 01/15/2017 1415 Last data filed at 01/15/2017 1000 Gross per 24 hour  Intake 8540 ml  Output 1570 ml  Net 6970 ml   Filed Weights   01/11/17 1241 01/11/17 1959  Weight: 81.6 kg  (180 lb) 84 kg (185 lb 3 oz)    Examination:  General exam:  Adult male.  No acute distress.  HEENT:  NCAT, MMM Respiratory system: Clear to auscultation bilaterally Cardiovascular system: Regular rate and rhythm, normal S1/S2. No murmurs, rubs, gallops or clicks.  Warm extremities Gastrointestinal system:  Faint bowel sounds, mildly distended, minimal TTP just about colostomy site.  Scant serosanguinous drainage in ostomy bag.  Midline incision without erythema or obvious purulence.   MSK:  Normal tone and bulk, no lower extremity edema Neuro:  Grossly moves all extremites    Data Reviewed: I have personally reviewed following labs and imaging studies  CBC: Recent Labs  Lab 01/11/17 1359 01/12/17 0404 01/15/17 0444  WBC 10.2 6.9 14.7*  HGB 14.0 12.8* 11.8*  HCT 39.2 37.2* 34.7*  MCV 82.2 83.0 83.8  PLT 382 359 509   Basic Metabolic Panel: Recent Labs  Lab 01/11/17 1359 01/12/17 0404 01/15/17 0444  NA 129* 135 136  K 4.0 3.9 4.0  CL 96* 105 104  CO2 22 23 27   GLUCOSE 117* 119* 153*  BUN 12 10 6   CREATININE 0.99 0.87 0.94  CALCIUM 8.7* 8.5* 8.1*   GFR: Estimated Creatinine Clearance: 73.4 mL/min (by C-G formula based on SCr of 0.94 mg/dL). Liver Function Tests: Recent Labs  Lab 01/11/17 1359 01/12/17 0404  AST 26 20  ALT 18 15*  ALKPHOS 77 67  BILITOT 0.5 0.6  PROT 7.0 6.2*  ALBUMIN 3.3* 3.1*   Recent Labs  Lab 01/11/17 1359  LIPASE 23   No results for input(s): AMMONIA in the last 168 hours. Coagulation Profile: No results for input(s): INR, PROTIME in the last 168 hours. Cardiac Enzymes: No results for input(s): CKTOTAL, CKMB, CKMBINDEX, TROPONINI in the last 168 hours. BNP (last 3 results) No results for input(s): PROBNP in the last 8760 hours. HbA1C: No results for input(s): HGBA1C in the last 72 hours. CBG: No results for input(s): GLUCAP in the last 168 hours. Lipid Profile: No results for input(s): CHOL, HDL, LDLCALC, TRIG, CHOLHDL,  LDLDIRECT in the last 72 hours. Thyroid Function Tests: No results for input(s): TSH, T4TOTAL, FREET4, T3FREE, THYROIDAB in the last 72 hours. Anemia Panel: No results for input(s): VITAMINB12, FOLATE, FERRITIN, TIBC, IRON, RETICCTPCT in the last 72 hours. Urine analysis:    Component Value Date/Time   COLORURINE STRAW (A) 01/11/2017 1600   APPEARANCEUR CLEAR 01/11/2017 1600   LABSPEC 1.017 01/11/2017 1600   PHURINE 7.0 01/11/2017 1600   GLUCOSEU NEGATIVE 01/11/2017 1600   HGBUR NEGATIVE 01/11/2017 1600   BILIRUBINUR NEGATIVE 01/11/2017 1600   KETONESUR 5 (A) 01/11/2017 1600   PROTEINUR NEGATIVE 01/11/2017 1600   NITRITE NEGATIVE 01/11/2017 1600   LEUKOCYTESUR NEGATIVE 01/11/2017 1600   Sepsis Labs: @LABRCNTIP (procalcitonin:4,lacticidven:4)  ) Recent Results (from the past 240 hour(s))  Surgical pcr screen  Status: None   Collection Time: 01/13/17  9:49 PM  Result Value Ref Range Status   MRSA, PCR NEGATIVE NEGATIVE Final   Staphylococcus aureus NEGATIVE NEGATIVE Final    Comment: (NOTE) The Xpert SA Assay (FDA approved for NASAL specimens in patients 94 years of age and older), is one component of a comprehensive surveillance program. It is not intended to diagnose infection nor to guide or monitor treatment.       Radiology Studies: No results found.   Scheduled Meds: . acetaminophen  1,000 mg Oral Q8H  . amLODipine  5 mg Oral QPM  . benazepril  10 mg Oral QPM  . iopamidol       Continuous Infusions: . dextrose 5 % and 0.9% NaCl 50 mL/hr at 01/15/17 0824     LOS: 3 days    Time spent: 30 min    Janece Canterbury, MD [facility] Pager [phone]  If 7PM-7AM, please contact night-coverage [email or web address] Password [email or web address] 01/15/2017, 2:15 PM

## 2017-01-15 NOTE — Progress Notes (Signed)
1 Day Post-Op lap assisted sigmoidectomy, end colostomy Subjective: Tolerating clears, pain controlled, ostomy with flatus  Objective: Vital signs in last 24 hours: Temp:  [97.9 F (36.6 C)-98.5 F (36.9 C)] 98.4 F (36.9 C) (11/17 0559) Pulse Rate:  [77-102] 77 (11/17 0559) Resp:  [11-18] 18 (11/17 0559) BP: (113-125)/(66-82) 113/66 (11/17 0559) SpO2:  [94 %-100 %] 98 % (11/17 0559)   Intake/Output from previous day: 11/16 0701 - 11/17 0700 In: 9715 [P.O.:30; I.V.:9435; IV Piggyback:250] Out: 4561 [Urine:1461; Blood:100] Intake/Output this shift: Total I/O In: 400 [P.O.:400] Out: -    General appearance: alert and cooperative GI: normal findings: soft, appropriately tender  Incision: no significant drainage  Lab Results:  Recent Labs    01/15/17 0444  WBC 14.7*  HGB 11.8*  HCT 34.7*  PLT 337   BMET Recent Labs    01/15/17 0444  NA 136  K 4.0  CL 104  CO2 27  GLUCOSE 153*  BUN 6  CREATININE 0.94  CALCIUM 8.1*   PT/INR No results for input(s): LABPROT, INR in the last 72 hours. ABG No results for input(s): PHART, HCO3 in the last 72 hours.  Invalid input(s): PCO2, PO2  MEDS, Scheduled . acetaminophen  1,000 mg Oral Q8H  . amLODipine  5 mg Oral QPM  . benazepril  10 mg Oral QPM    Studies/Results: No results found.  Assessment: s/p Procedure(s): LAPAROSCOPIC SIGMOIDECTOMY , OMENTECTOMY BIOPSY WITH END COLOSTOMY Patient Active Problem List   Diagnosis Date Noted  . Mass of colon   . Left lower quadrant pain   . Abnormal computed tomography of sigmoid colon 01/11/2017  . Metastatic disease ([place]) 01/11/2017  . Essential hypertension 01/11/2017  . SCC (squamous cell carcinoma) 01/11/2017  . Hyponatremia 01/11/2017    Expected post op course  Plan: Advance diet to fulls Decrease MIV Ambulate Hopefully can place port prior to d/c   LOS: 3 days     .Rosario Adie, MD [facility],  [place]   01/15/2017 8:21 AM

## 2017-01-15 NOTE — Progress Notes (Signed)
ANTICOAGULATION CONSULT NOTE - Initial Consult  Pharmacy Consult for Lovenox Indication: VTE prophylaxis  No Known Allergies  Patient Measurements: Height: 6' (182.9 cm) Weight: 185 lb 3 oz (84 kg) IBW/kg (Calculated) : 77.6  Vital Signs: Temp: 98.6 F (37 C) (11/17 1421) Temp Source: Oral (11/17 1421) BP: 126/68 (11/17 1421) Pulse Rate: 87 (11/17 1421)  Labs: Recent Labs    01/15/17 0444  HGB 11.8*  HCT 34.7*  PLT 337  CREATININE 0.94    Estimated Creatinine Clearance: 73.4 mL/min (by C-G formula based on SCr of 0.94 mg/dL).   Medical History: Past Medical History:  Diagnosis Date  . History of traumatic subdural hematoma    08-05-2005  --- pt fell off roof--- per imaging right posteroparietal-right parafalcine subdural hematoma w/ concussion --- per pt no residual  . Hypertension   . Nocturia   . Penile mass   . Phimosis   . Pre-diabetes   . Wears dentures    upper    Medications:  Scheduled:  . acetaminophen  1,000 mg Oral Q8H  . amLODipine  5 mg Oral QPM  . benazepril  10 mg Oral QPM   Infusions:  . dextrose 5 % and 0.9% NaCl 50 mL/hr at 01/15/17 1761    Assessment: PHARMACY CONSULT: Lovenox for VTE prophylaxis   Wt: 84 kg  Scr:  0.94, CrCl >30 ml/hr  CBC:   Plan:   Lovenox 40mg  SQ q24h.  Dosage remains stable and need for further dosage adjustment appears unlikely at present.  Pharmacy will sign off at this time.  Please reconsult if a change in clinical status warrants re-evaluation of dosage.   Gretta Arab PharmD, BCPS Pager [phone] 01/15/2017 2:59 PM

## 2017-01-16 LAB — CBC
HEMATOCRIT: 32.8 % — AB (ref 39.0–52.0)
HEMOGLOBIN: 11.3 g/dL — AB (ref 13.0–17.0)
MCH: 29.1 pg (ref 26.0–34.0)
MCHC: 34.5 g/dL (ref 30.0–36.0)
MCV: 84.5 fL (ref 78.0–100.0)
Platelets: 322 10*3/uL (ref 150–400)
RBC: 3.88 MIL/uL — ABNORMAL LOW (ref 4.22–5.81)
RDW: 13.4 % (ref 11.5–15.5)
WBC: 12.4 10*3/uL — ABNORMAL HIGH (ref 4.0–10.5)

## 2017-01-16 NOTE — Progress Notes (Signed)
2 Days Post-Op   Subjective/Chief Complaint: Comfortable; has not used much pain medicine Felt full after eating last night Much flatus in bag - not much stool   Objective: Vital signs in last 24 hours: Temp:  [98.3 F (36.8 C)-98.6 F (37 C)] 98.3 F (36.8 C) (11/18 0554) Pulse Rate:  [72-87] 72 (11/18 0554) Resp:  [16-18] 18 (11/18 0554) BP: (120-128)/(64-74) 120/74 (11/18 0554) SpO2:  [96 %-97 %] 97 % (11/18 0554) Last BM Date: 01/13/17  Intake/Output from previous day: 11/17 0701 - 11/18 0700 In: 3025 [P.O.:1705; I.V.:1320] Out: 8119 [Urine:1700; Stool:48] Intake/Output this shift: No intake/output data recorded.  General appearance: alert, cooperative and no distress Resp: clear to auscultation bilaterally Cardio: regular rate and rhythm, S1, S2 normal, no murmur, click, rub or gallop GI: soft; + BS; minimally tender Ostomy pink - gas in bag with some thin output; midline incision - no drainage Removed xeroform gauze around penis - no bleeding;  Lab Results:  Recent Labs    01/15/17 0444 01/16/17 0358  WBC 14.7* 12.4*  HGB 11.8* 11.3*  HCT 34.7* 32.8*  PLT 337 322   BMET Recent Labs    01/15/17 0444  NA 136  K 4.0  CL 104  CO2 27  GLUCOSE 153*  BUN 6  CREATININE 0.94  CALCIUM 8.1*   PT/INR No results for input(s): LABPROT, INR in the last 72 hours. ABG No results for input(s): PHART, HCO3 in the last 72 hours.  Invalid input(s): PCO2, PO2  Studies/Results: Ct Chest W Contrast  Result Date: 01/15/2017 CLINICAL DATA:  Status post resection of obstructing sigmoid colonic adenocarcinoma. EXAM: CT CHEST WITH CONTRAST TECHNIQUE: Multidetector CT imaging of the chest was performed during intravenous contrast administration. CONTRAST:  75 mL ISOVUE-300 IV COMPARISON:  CT of the abdomen and pelvis on 01/11/2017 FINDINGS: Cardiovascular: The heart size is normal. The thoracic aorta shows no evidence of aneurysmal disease. Calcified coronary artery plaque  identified in a 3 vessel distribution. No pericardial fluid. Central pulmonary arteries are normal in caliber. Mediastinum/Nodes: Tiny mediastinal lymph nodes are present. No enlarged mediastinal, axillary or hilar lymphadenopathy identified. Lungs/Pleura: No pulmonary nodules or airspace consolidation. No pulmonary edema. Small left pleural effusion and trace right pleural effusion. No obvious pleural nodularity. Upper Abdomen: Small amount of free intraperitoneal air identified in the upper abdomen related to recent abdominal surgery. Musculoskeletal: No bony lesions or fractures. IMPRESSION: 1. No evidence of metastatic disease in the chest. 2. Small left pleural effusion and trace right pleural effusion without obvious pleural metastatic disease. 3. Coronary atherosclerosis in a 3 vessel distribution. 4. Free intraperitoneal air in the upper abdomen related to recent abdominal surgery. Electronically Signed   By: Aletta Edouard M.D.   On: 01/15/2017 14:58    Anti-infectives: Anti-infectives (From admission, onward)   Start     Dose/Rate Route Frequency Ordered Stop   01/14/17 1046  cefoTEtan in Dextrose 5% (CEFOTAN) 2-2.08 GM-%(50ML) IVPB    Comments:  Bridget Hartshorn   : cabinet override      01/14/17 1046 01/14/17 2259   01/14/17 0600  cefoTEtan (CEFOTAN) 2 g in dextrose 5 % 50 mL IVPB  Status:  Discontinued     2 g 100 mL/hr over 30 Minutes Intravenous On call to O.R. 01/13/17 1427 01/13/17 1437   01/14/17 0600  cefoTEtan (CEFOTAN) 2 g in dextrose 5 % 50 mL IVPB  Status:  Discontinued     2 g 100 mL/hr over 30 Minutes Intravenous On call  to O.R. 01/13/17 1438 01/13/17 1438   01/14/17 0600  cefoTEtan (CEFOTAN) 2 g in dextrose 5 % 50 mL IVPB     2 g 100 mL/hr over 30 Minutes Intravenous On call to O.R. 01/13/17 1439 01/14/17 1129      Assessment/Plan: SCC Penis - s/p recent circumcision by Dr. Karsten Ro HTN Obstructing sigmoid cancer - s/p laparoscopic sigmoid colectomy/ Hartmann's  procedure by Dr. Marcello Moores 01/14/17 Advance diet Will need port prior to discharge. Will make NPO p MN.    LOS: 4 days    Maia Petties 01/16/2017

## 2017-01-16 NOTE — Progress Notes (Signed)
PROGRESS NOTE  Thomas Wagner  QIW:[id number] DOB: [birth year] DOA: 01/11/2017 PCP: [name], MD  Brief Narrative:    RIGHT hand Dx, HTN, prediabetes, Penile Squamous Cell Carcinoma Dx 01/03/2014 (seen byDr. Kathie Rhodes Urology) resented with decreased appetite, nausea, abdominal pain times 2-3 weeks.  He was found to have an obstructing colorectal mass.  He underwent resection with colostomy placement and biopsies of his omentum on 11/16.   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  -  Limited prognosis given signet ring cells and metastatic disease - Dr. Leighton Ruff performed colon resection on 11/16 -  CEA 3.2 -  CT chest:  No evidence of mets -  PORT placement tomorrow -  Patient to follow up with Oncology, Dr. Burr Medico in 2 weeks after discharge -  Scrotal elevator ordered.  Patient reassured.  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 Kathie Rhodes Urologyafter patient's colon cancer issues resolved. -Although, Dr. Kathie Rhodes Urologywanted patient to see [facility]ology specialist in [facility], will not be stable for several weeks.   Nausea, vomiting due to obstruction, resolved.   -  Advanced to soft diet  Hyponatremia:  -Resolved  Essential HTN, BP stable -Amlodipine 5 mgdaily -Lotensin 10 mgdaily   Leukocytosis, likely reactive from surgery, no signs of infection, trending down  Mild normocytic anemia likely due to cancer and recent surgery, hgb approximately stable  DVT prophylaxis:  lovenox Code Status: Full code Family Communication: Patient and extended family at bedside Disposition Plan:  Ambulating in halls,  advancing diet.  Port tomorrow  Consultants:   General Surgery  Oncology  Gastroenterology, [place], Dr. Loletha Carrow  Procedures:   11/15 Sigmoidoscopy:- Likely malignant completely obstructing tumor in the recto-sigmoid colon. Biopsied.  11/16 Resection of colorectal mass with colostomy placement, removal of ascites and biopsy of omental nodules, LN resection    Antimicrobials:  Anti-infectives (From admission, onward)   Start     Dose/Rate Route Frequency Ordered Stop   01/14/17 1046  cefoTEtan in Dextrose 5% (CEFOTAN) 2-2.08 GM-%(50ML) IVPB    Comments:  Bridget Hartshorn   : cabinet override      01/14/17 1046 01/14/17 2259   01/14/17 0600  cefoTEtan (CEFOTAN) 2 g in dextrose 5 % 50 mL IVPB  Status:  Discontinued     2 g 100 mL/hr over 30 Minutes Intravenous On call to O.R. 01/13/17 1427 01/13/17 1437   01/14/17 0600  cefoTEtan (CEFOTAN) 2 g in dextrose 5 % 50 mL IVPB  Status:  Discontinued     2 g 100 mL/hr over 30 Minutes Intravenous On call to O.R. 01/13/17 1438 01/13/17 1438   01/14/17 0600  cefoTEtan (CEFOTAN) 2 g in dextrose 5 % 50 mL IVPB     2 g 100 mL/hr over 30 Minutes Intravenous On call to O.R. 01/13/17 1439 01/14/17 1129       Subjective:  Scrotum is very swollen and uncomfortable.  No much in ostomy except air, but tolerating full liquids diet well.  Ambulating in halls.    Objective: Vitals:   01/15/17 1530 01/15/17 2103 01/16/17 0554 01/16/17 1224  BP: 126/68 128/64 120/74 126/70  Pulse: 87 80 72 85  Resp: 16  18 18 17   Temp: 98.6 F (37 C) 98.5 F (36.9 C) 98.3 F (36.8 C) 98.5 F (36.9 C)  TempSrc: Oral Oral Oral Oral  SpO2: 96% 96% 97% 98%  Weight:      Height:        Intake/Output Summary (Last 24 hours) at 01/16/2017 1605 Last data filed at 01/16/2017 1230 Gross per 24 hour  Intake 1835 ml  Output 1285 ml  Net 550 ml   Filed Weights   01/11/17 1241 01/11/17 1959  Weight: 81.6 kg (180 lb) 84 kg (185 lb 3 oz)     Examination:  General exam:  Adult male.  No acute distress.  HEENT:  NCAT, MMM Respiratory system: Clear to auscultation bilaterally Cardiovascular system: Regular rate and rhythm, normal S1/S2. No murmurs, rubs, gallops or clicks.  Warm extremities Gastrointestinal system: Normal active bowel sounds, soft, mildly distended, nontender.  Scant serosanguinous drainage in bag with some air.  Midline incision without erythema.   GU:  Scrotum is swollen MSK:  Normal tone and bulk, no lower extremity edema Neuro:  Grossly intact   Data Reviewed: I have personally reviewed following labs and imaging studies  CBC: Recent Labs  Lab 01/11/17 1359 01/12/17 0404 01/15/17 0444 01/16/17 0358  WBC 10.2 6.9 14.7* 12.4*  HGB 14.0 12.8* 11.8* 11.3*  HCT 39.2 37.2* 34.7* 32.8*  MCV 82.2 83.0 83.8 84.5  PLT 382 359 337 756   Basic Metabolic Panel: Recent Labs  Lab 01/11/17 1359 01/12/17 0404 01/15/17 0444  NA 129* 135 136  K 4.0 3.9 4.0  CL 96* 105 104  CO2 22 23 27   GLUCOSE 117* 119* 153*  BUN 12 10 6   CREATININE 0.99 0.87 0.94  CALCIUM 8.7* 8.5* 8.1*   GFR: Estimated Creatinine Clearance: 73.4 mL/min (by C-G formula based on SCr of 0.94 mg/dL). Liver Function Tests: Recent Labs  Lab 01/11/17 1359 01/12/17 0404  AST 26 20  ALT 18 15*  ALKPHOS 77 67  BILITOT 0.5 0.6  PROT 7.0 6.2*  ALBUMIN 3.3* 3.1*   Recent Labs  Lab 01/11/17 1359  LIPASE 23   No results for input(s): AMMONIA in the last 168 hours. Coagulation Profile: No results for input(s): INR, PROTIME in the last 168 hours. Cardiac Enzymes: No results for input(s): CKTOTAL, CKMB, CKMBINDEX, TROPONINI in the last 168 hours. BNP (last 3 results) No results for input(s): PROBNP in the last 8760 hours. HbA1C: No results for input(s): HGBA1C in the last 72 hours. CBG: No results for input(s): GLUCAP in the last 168 hours. Lipid Profile: No results for input(s): CHOL, HDL, LDLCALC, TRIG, CHOLHDL, LDLDIRECT  in the last 72 hours. Thyroid Function Tests: No results for input(s): TSH, T4TOTAL, FREET4, T3FREE, THYROIDAB in the last 72 hours. Anemia Panel: No results for input(s): VITAMINB12, FOLATE, FERRITIN, TIBC, IRON, RETICCTPCT in the last 72 hours. Urine analysis:    Component Value Date/Time   COLORURINE STRAW (A) 01/11/2017 1600   APPEARANCEUR CLEAR 01/11/2017 1600   LABSPEC 1.017 01/11/2017 1600   PHURINE 7.0 01/11/2017 1600   GLUCOSEU NEGATIVE 01/11/2017 1600   HGBUR NEGATIVE 01/11/2017 1600   BILIRUBINUR NEGATIVE 01/11/2017 1600   KETONESUR 5 (A) 01/11/2017 1600   PROTEINUR NEGATIVE 01/11/2017 1600   NITRITE NEGATIVE 01/11/2017 1600   LEUKOCYTESUR NEGATIVE 01/11/2017 1600   Sepsis Labs: @LABRCNTIP (procalcitonin:4,lacticidven:4)  ) Recent Results (from the past 240 hour(s))  Surgical pcr screen     Status: None   Collection Time: 01/13/17  9:49 PM  Result Value Ref Range Status   MRSA, PCR NEGATIVE NEGATIVE Final   Staphylococcus aureus NEGATIVE NEGATIVE Final    Comment: (NOTE) The Xpert SA Assay (FDA approved for NASAL specimens in patients 85 years of age and older), is one component of a comprehensive surveillance program. It is not intended to diagnose infection nor to guide or monitor treatment.       Radiology Studies: Ct Chest W Contrast  Result Date: 01/15/2017 CLINICAL DATA:  Status post resection of obstructing sigmoid colonic adenocarcinoma. EXAM: CT CHEST WITH CONTRAST TECHNIQUE: Multidetector CT imaging of the chest was performed during intravenous contrast administration. CONTRAST:  75 mL ISOVUE-300 IV COMPARISON:  CT of the abdomen and pelvis on 01/11/2017 FINDINGS: Cardiovascular: The heart size is normal. The thoracic aorta shows no evidence of aneurysmal disease. Calcified coronary artery plaque identified in a 3 vessel distribution. No pericardial fluid. Central pulmonary arteries are normal in caliber. Mediastinum/Nodes: Tiny mediastinal lymph nodes  are present. No enlarged mediastinal, axillary or hilar lymphadenopathy identified. Lungs/Pleura: No pulmonary nodules or airspace consolidation. No pulmonary edema. Small left pleural effusion and trace right pleural effusion. No obvious pleural nodularity. Upper Abdomen: Small amount of free intraperitoneal air identified in the upper abdomen related to recent abdominal surgery. Musculoskeletal: No bony lesions or fractures. IMPRESSION: 1. No evidence of metastatic disease in the chest. 2. Small left pleural effusion and trace right pleural effusion without obvious pleural metastatic disease. 3. Coronary atherosclerosis in a 3 vessel distribution. 4. Free intraperitoneal air in the upper abdomen related to recent abdominal surgery. Electronically Signed   By: Aletta Edouard M.D.   On: 01/15/2017 14:58     Scheduled Meds: . acetaminophen  1,000 mg Oral Q8H  . amLODipine  5 mg Oral QPM  . benazepril  10 mg Oral QPM  . enoxaparin (LOVENOX) injection  40 mg Subcutaneous Q24H   Continuous Infusions:    LOS: 4 days    Time spent: 30 min    Janece Canterbury, MD [facility] Pager [phone]  If 7PM-7AM, please contact night-coverage [email or web address] Password [email or web address] 01/16/2017, 4:05 PM

## 2017-01-16 NOTE — Progress Notes (Signed)
Pt refused scrotal support.

## 2017-01-17 ENCOUNTER — Inpatient Hospital Stay (HOSPITAL_COMMUNITY): Payer: Medicare Other | Admitting: Anesthesiology

## 2017-01-17 ENCOUNTER — Encounter (HOSPITAL_COMMUNITY)
Admission: EM | Disposition: A | Discharge status: Home Health | Payer: Self-pay | Source: Home / Self Care | Attending: Internal Medicine

## 2017-01-17 ENCOUNTER — Inpatient Hospital Stay (HOSPITAL_COMMUNITY): Payer: Medicare Other

## 2017-01-17 ENCOUNTER — Encounter (HOSPITAL_COMMUNITY): Payer: Self-pay | Admitting: General Surgery

## 2017-01-17 HISTORY — PX: PORTACATH PLACEMENT: SHX2246

## 2017-01-17 LAB — CBC
HCT: 34.1 % — ABNORMAL LOW (ref 39.0–52.0)
HEMOGLOBIN: 11.5 g/dL — AB (ref 13.0–17.0)
MCH: 28.4 pg (ref 26.0–34.0)
MCHC: 33.7 g/dL (ref 30.0–36.0)
MCV: 84.2 fL (ref 78.0–100.0)
PLATELETS: 320 10*3/uL (ref 150–400)
RBC: 4.05 MIL/uL — AB (ref 4.22–5.81)
RDW: 13.3 % (ref 11.5–15.5)
WBC: 11.1 10*3/uL — AB (ref 4.0–10.5)

## 2017-01-17 SURGERY — INSERTION, TUNNELED CENTRAL VENOUS DEVICE, WITH PORT
Anesthesia: General | Site: Chest

## 2017-01-17 MED ORDER — OXYCODONE HCL 5 MG/5ML PO SOLN: 5.0000 mg | Freq: Once | ORAL | Status: DC | PRN

## 2017-01-17 MED ORDER — LIDOCAINE HCL (CARDIAC) 20 MG/ML IV SOLN
INTRAVENOUS | Status: DC | PRN
  Administered 2017-01-17: 100 mg via INTRATRACHEAL

## 2017-01-17 MED ORDER — HEPARIN SODIUM (PORCINE) 5000 UNIT/ML IJ SOLN
Freq: Once | INTRAMUSCULAR | Status: AC
  Administered 2017-01-17: 11:00:00
  Filled 2017-01-17 (×2): qty 1.2

## 2017-01-17 MED ORDER — HEPARIN SOD (PORK) LOCK FLUSH 100 UNIT/ML IV SOLN
INTRAVENOUS | Status: AC
  Filled 2017-01-17: qty 5

## 2017-01-17 MED ORDER — BUPIVACAINE-EPINEPHRINE 0.25% -1:200000 IJ SOLN
INTRAMUSCULAR | Status: DC | PRN
Start: 2017-01-17 — End: 2017-01-17
  Administered 2017-01-17: 10 mL

## 2017-01-17 MED ORDER — BUPIVACAINE-EPINEPHRINE 0.25% -1:200000 IJ SOLN
INTRAMUSCULAR | Status: AC
  Filled 2017-01-17: qty 1

## 2017-01-17 MED ORDER — CEFAZOLIN SODIUM-DEXTROSE 2-4 GM/100ML-% IV SOLN
INTRAVENOUS | Status: AC
  Filled 2017-01-17: qty 100

## 2017-01-17 MED ORDER — FENTANYL CITRATE (PF) 100 MCG/2ML IJ SOLN
INTRAMUSCULAR | Status: DC | PRN
  Administered 2017-01-17: 25 ug via INTRAVENOUS

## 2017-01-17 MED ORDER — EPHEDRINE 5 MG/ML INJ
INTRAVENOUS | Status: AC
  Filled 2017-01-17: qty 10

## 2017-01-17 MED ORDER — CEFAZOLIN (ANCEF) 1 G IV SOLR: 2.0000 g | INTRAVENOUS | Status: DC

## 2017-01-17 MED ORDER — OXYCODONE HCL 5 MG PO TABS: 5.0000 mg | ORAL_TABLET | Freq: Once | ORAL | Status: DC | PRN

## 2017-01-17 MED ORDER — LACTATED RINGERS IV SOLN
INTRAVENOUS | Status: DC
  Administered 2017-01-17 (×2): via INTRAVENOUS

## 2017-01-17 MED ORDER — PROPOFOL 10 MG/ML IV BOLUS
INTRAVENOUS | Status: AC
  Filled 2017-01-17: qty 20

## 2017-01-17 MED ORDER — EPHEDRINE SULFATE 50 MG/ML IJ SOLN
INTRAMUSCULAR | Status: DC | PRN
  Administered 2017-01-17: 5 mg via INTRAVENOUS
  Administered 2017-01-17 (×2): 10 mg via INTRAVENOUS

## 2017-01-17 MED ORDER — FENTANYL CITRATE (PF) 100 MCG/2ML IJ SOLN
INTRAMUSCULAR | Status: AC
  Filled 2017-01-17: qty 2

## 2017-01-17 MED ORDER — HYDROMORPHONE HCL 1 MG/ML IJ SOLN: 0.2500 mg | INTRAMUSCULAR | Status: DC | PRN

## 2017-01-17 MED ORDER — PROMETHAZINE HCL 25 MG/ML IJ SOLN: 6.2500 mg | INTRAMUSCULAR | Status: DC | PRN

## 2017-01-17 MED ORDER — CEFAZOLIN SODIUM-DEXTROSE 2-3 GM-%(50ML) IV SOLR
INTRAVENOUS | Status: DC | PRN
  Administered 2017-01-17: 2 g via INTRAVENOUS

## 2017-01-17 MED ORDER — CEFAZOLIN SODIUM-DEXTROSE 2-4 GM/100ML-% IV SOLN: 2.0000 g | Freq: Once | INTRAVENOUS | Status: DC

## 2017-01-17 MED ORDER — HEPARIN SOD (PORK) LOCK FLUSH 100 UNIT/ML IV SOLN
INTRAVENOUS | Status: DC | PRN
  Administered 2017-01-17: 500 [IU] via INTRAVENOUS

## 2017-01-17 MED ORDER — LIDOCAINE 2% (20 MG/ML) 5 ML SYRINGE
INTRAMUSCULAR | Status: AC
  Filled 2017-01-17: qty 5

## 2017-01-17 MED ORDER — PHENYLEPHRINE 40 MCG/ML (10ML) SYRINGE FOR IV PUSH (FOR BLOOD PRESSURE SUPPORT)
PREFILLED_SYRINGE | INTRAVENOUS | Status: AC
  Filled 2017-01-17: qty 10

## 2017-01-17 MED ORDER — PHENYLEPHRINE 40 MCG/ML (10ML) SYRINGE FOR IV PUSH (FOR BLOOD PRESSURE SUPPORT)
PREFILLED_SYRINGE | INTRAVENOUS | Status: DC | PRN
  Administered 2017-01-17: 80 ug via INTRAVENOUS
  Administered 2017-01-17: 40 ug via INTRAVENOUS
  Administered 2017-01-17: 80 ug via INTRAVENOUS
  Administered 2017-01-17: 120 ug via INTRAVENOUS

## 2017-01-17 MED ORDER — ONDANSETRON HCL 4 MG/2ML IJ SOLN
INTRAMUSCULAR | Status: AC
  Filled 2017-01-17: qty 2

## 2017-01-17 MED ORDER — PROPOFOL 10 MG/ML IV BOLUS
INTRAVENOUS | Status: DC | PRN
  Administered 2017-01-17: 170 mg via INTRAVENOUS

## 2017-01-17 SURGICAL SUPPLY — 33 items
ADH SKN CLS APL DERMABOND .7 (GAUZE/BANDAGES/DRESSINGS) ×1
APL SKNCLS STERI-STRIP NONHPOA (GAUZE/BANDAGES/DRESSINGS)
BAG DECANTER FOR FLEXI CONT (MISCELLANEOUS) ×3 IMPLANT
BENZOIN TINCTURE PRP APPL 2/3 (GAUZE/BANDAGES/DRESSINGS) IMPLANT
BLADE SURG 15 STRL LF DISP TIS (BLADE) ×1 IMPLANT
BLADE SURG 15 STRL SS (BLADE) ×3
CHLORAPREP W/TINT 26ML (MISCELLANEOUS) ×3 IMPLANT
CLOSURE WOUND 1/2 X4 (GAUZE/BANDAGES/DRESSINGS)
COVER SURGICAL LIGHT HANDLE (MISCELLANEOUS) ×3 IMPLANT
DECANTER SPIKE VIAL GLASS SM (MISCELLANEOUS) ×3 IMPLANT
DERMABOND ADVANCED (GAUZE/BANDAGES/DRESSINGS) ×2
DERMABOND ADVANCED .7 DNX12 (GAUZE/BANDAGES/DRESSINGS) IMPLANT
DRAPE C-ARM 42X120 X-RAY (DRAPES) ×3 IMPLANT
DRAPE LAPAROSCOPIC ABDOMINAL (DRAPES) ×3 IMPLANT
ELECT PENCIL ROCKER SW 15FT (MISCELLANEOUS) ×3 IMPLANT
ELECT REM PT RETURN 15FT ADLT (MISCELLANEOUS) ×3 IMPLANT
GAUZE SPONGE 4X4 12PLY STRL (GAUZE/BANDAGES/DRESSINGS) IMPLANT
GAUZE SPONGE 4X4 16PLY XRAY LF (GAUZE/BANDAGES/DRESSINGS) ×3 IMPLANT
GLOVE BIOGEL PI IND STRL 7.5 (GLOVE) ×1 IMPLANT
GLOVE BIOGEL PI INDICATOR 7.5 (GLOVE) ×2
GLOVE ECLIPSE 7.5 STRL STRAW (GLOVE) ×3 IMPLANT
GOWN STRL REUS W/TWL XL LVL3 (GOWN DISPOSABLE) ×8 IMPLANT
KIT BASIN OR (CUSTOM PROCEDURE TRAY) ×3 IMPLANT
KIT PORT POWER 8FR ISP CVUE (Miscellaneous) ×2 IMPLANT
NEEDLE HYPO 22GX1.5 SAFETY (NEEDLE) ×3 IMPLANT
PACK BASIC VI WITH GOWN DISP (CUSTOM PROCEDURE TRAY) ×3 IMPLANT
STRIP CLOSURE SKIN 1/2X4 (GAUZE/BANDAGES/DRESSINGS) IMPLANT
SUT MNCRL AB 4-0 PS2 18 (SUTURE) ×3 IMPLANT
SUT PROLENE 2 0 CT2 30 (SUTURE) ×5 IMPLANT
SYR 10ML ECCENTRIC (SYRINGE) ×3 IMPLANT
SYR CONTROL 10ML LL (SYRINGE) ×3 IMPLANT
TOWEL OR 17X26 10 PK STRL BLUE (TOWEL DISPOSABLE) ×3 IMPLANT
TOWEL OR NON WOVEN STRL DISP B (DISPOSABLE) ×3 IMPLANT

## 2017-01-17 NOTE — Anesthesia Postprocedure Evaluation (Signed)
Anesthesia Post Note  Patient: Thomas Wagner  Procedure(s) Performed: LAPAROSCOPIC SIGMOIDECTOMY , OMENTECTOMY BIOPSY WITH END COLOSTOMY (N/A )     Patient location during evaluation: PACU Anesthesia Type: General Level of consciousness: awake and alert Pain management: pain level controlled Vital Signs Assessment: post-procedure vital signs reviewed and stable Respiratory status: spontaneous breathing, nonlabored ventilation, respiratory function stable and patient connected to nasal cannula oxygen Cardiovascular status: blood pressure returned to baseline and stable Postop Assessment: no apparent nausea or vomiting Anesthetic complications: no    Last Vitals:  Vitals:   01/17/17 1432 01/17/17 1729  BP: 135/79 138/83  Pulse: 98 92  Resp:  20  Temp: 37.3 C   SpO2: 97% 95%    Last Pain:  Vitals:   01/17/17 1500  TempSrc:   PainSc: 2                  Tyreak Reagle P Traevion Poehler

## 2017-01-17 NOTE — Anesthesia Preprocedure Evaluation (Signed)
Anesthesia Evaluation  Patient identified by MRN, date of birth, ID band Patient awake    Reviewed: Allergy & Precautions, NPO status , Patient's Chart, lab work & pertinent test results  History of Anesthesia Complications Negative for: history of anesthetic complications  Airway Mallampati: I  TM Distance: >3 FB Neck ROM: Full    Dental  (+) Upper Dentures, Dental Advisory Given   Pulmonary former smoker,    Pulmonary exam normal        Cardiovascular hypertension, Pt. on medications Normal cardiovascular exam  ECG: NSR, rate 88   Neuro/Psych negative neurological ROS  negative psych ROS   GI/Hepatic Neg liver ROS, obstructing colon mass   Endo/Other  Pre-diabetes  Renal/GU negative Renal ROS     Musculoskeletal negative musculoskeletal ROS (+)   Abdominal   Peds  Hematology  (+) anemia ,   Anesthesia Other Findings Metastatic Squamous cell carcinoma  Reproductive/Obstetrics                             Anesthesia Physical  Anesthesia Plan  ASA: III  Anesthesia Plan: General   Post-op Pain Management:    Induction: Intravenous  PONV Risk Score and Plan: 2 and Ondansetron, Treatment may vary due to age or medical condition and Midazolam  Airway Management Planned: LMA  Additional Equipment:   Intra-op Plan:   Post-operative Plan: Extubation in OR  Informed Consent: I have reviewed the patients History and Physical, chart, labs and discussed the procedure including the risks, benefits and alternatives for the proposed anesthesia with the patient or authorized representative who has indicated his/her understanding and acceptance.   Dental advisory given  Plan Discussed with: CRNA  Anesthesia Plan Comments:         Anesthesia Quick Evaluation

## 2017-01-17 NOTE — Consult Note (Signed)
[facility] Nurse ostomy consult note:  Initial consult. POD 3. Stoma type/location: LLQ colostomy  Stomal assessment/size: 1 and 5/8 inches round, red, edematous, moist.  Os at center. Peristomal assessment: Intact Treatment options for stomal/peristomal skin: skin barrier ring Output: small amount of serosanguinous, flatus, no stool Ostomy pouching: 2pc.  Education provided: Patient, wife and 2 daughters present for teaching session (initial).  Taught GI anatomy and physiology, stoma characteristics, pouch characteristics.  Demonstrated stoma measuring and stressed importance of resizing until stoma remains the same size for several weeks. Demonstrated pouch removal, pouch preparation (daughter cuts out pattern onto new skin barrier) and pouch application.  One piece teaching guide provided for patient.  Demonstrated emptying (simulated) using toilet paper wicks.  Patient able to perform Lock and Roll closure. Educational booklet provided for patient.  My contact information provided for patient.  Patient will require the services of a HHRN.  If you agree, please order. Enrolled patient in [place] program: Yes, today.  4 pouches (opaque with integrated gas filters), 4 rings, 4 CeraPlus skin barriers. 1 belt.  Stoma powder.  [facility] nursing team will follow, and will remain available to this patient, the nursing and medical teams.   Thanks, Maudie Flakes, MSN, RN, [place], Arther Abbott  Pager# [phone]

## 2017-01-17 NOTE — Progress Notes (Signed)
PROGRESS NOTE  Thomas Wagner  VQM:[id number] DOB: 08-01-1940 DOA: 01/11/2017 PCP: [name], MD  Brief Narrative:   The patient is a 76 yo M with history of HTN, prediabetes, Penile Squamous Cell Carcinoma Dx 01/03/2014 (seen byDr. Kathie Rhodes Urology)who presented with decreased appetite, nausea, abdominal pain for the last 2-3 weeks. He was found to have an obstructing colorectal mass. He underwent colonoscopy with biopsy which revealed adenocarcinoma with signet ring cells.  He underwent resection with colostomy placement, creation of Hartmann's pouch, and biopsies of his omentum on 11/16.  He has recovered quickly from surgery.  He underwent port placement on 11/19.  Awaiting stool in ostomy.       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   - Colonscopy with biopsy on 11/15:  Adenocarcinoma with signet ring cells  -Dr. Elmo Putt Thomasperformedcolon resection on 11/16 - CEA 3.2 - CT chest: No evidence of mets -  Cytology from ascitic fluid and surgical pathology from colon resection are still pending - PORT placed on 11/19 - Patient to follow up with Oncology, Dr. Burr Medico in 2 weeks after discharge -  Awaiting stool output -  Continue regular diet  Squamous cell carcinoma penis -Diagnosed 11/5.ConsultDr. Kathie Rhodes Urologyafter patient's colon cancer issues resolved. -Although, Dr. Kathie Rhodes Urologywanted patient to see oncology specialist in [facility], will not be stable for several weeks.   Nausea, vomiting due to obstruction, resolved.  -  Tolerating a soft diet  Hyponatremia:  -Resolved  Essential HTN, BP  stable -Amlodipine 2.5 mgdaily -Lotensin 10 mgdaily   Leukocytosis,likely reactive from surgery, no signs of infection, trending down  Mild normocytic anemia likely due to cancer and recent surgery, hgb approximately stable   DVT prophylaxis:  lovenox Code Status: Full code Family Communication: Patient and extended family at bedside Disposition Plan:   Awaiting stool output  Consultants:   General Surgery  Oncology  Gastroenterology, [place], Dr. Loletha Carrow  Procedures:   11/15 Sigmoidoscopy:- Likely malignant completely obstructing tumor in the recto-sigmoid colon. Biopsied.  11/16 Resection of colorectal mass with colostomy placement, removal of ascites and biopsy of omental nodules, LN resection    Antimicrobials:  Anti-infectives (From admission, onward)   Start     Dose/Rate Route Frequency Ordered Stop   01/17/17 0908  ceFAZolin (ANCEF) 2-4 GM/100ML-% IVPB    Comments:  Harvell, Gwendolyn  : cabinet override      01/17/17 0908 01/17/17 2114   01/17/17 0900  ceFAZolin (ANCEF) IVPB 2g/100 mL premix     2 g 200 mL/hr over 30 Minutes Intravenous  Once 01/17/17 0802     01/17/17 0800  ceFAZolin (ANCEF) powder 2 g  Status:  Discontinued     2 g Other To Surgery 01/17/17 0746 01/17/17 0802   01/14/17 1046  cefoTEtan in Dextrose 5% (CEFOTAN) 2-2.08 GM-%(50ML) IVPB    Comments:  Bridget Hartshorn   : cabinet override      01/14/17 1046 01/14/17 2259   01/14/17 0600  cefoTEtan (CEFOTAN) 2 g in dextrose 5 % 50 mL IVPB  Status:  Discontinued     2 g 100 mL/hr over 30 Minutes Intravenous On call to O.R. 01/13/17 1427  01/13/17 1437   01/14/17 0600  cefoTEtan (CEFOTAN) 2 g in dextrose 5 % 50 mL IVPB  Status:  Discontinued     2 g 100 mL/hr over 30 Minutes Intravenous On call to O.R. 01/13/17 1438 01/13/17 1438   01/14/17 0600  cefoTEtan (CEFOTAN) 2 g in dextrose 5 % 50 mL IVPB     2 g 100 mL/hr over 30 Minutes Intravenous On call to O.R. 01/13/17 1439 01/14/17 1129        Subjective: Feeling well.  Denies SOB, abdominal pains.  Not much out through ostomy other than air yet.  Scrotum is still swollen.      Objective: Vitals:   01/17/17 1200 01/17/17 1316 01/17/17 1432 01/17/17 1729  BP: (!) 148/87 139/75 135/79 138/83  Pulse: 93 97 98 92  Resp: 18 17  20   Temp: 98.2 F (36.8 C) 98.6 F (37 C) 99.1 F (37.3 C)   TempSrc:  Oral Oral   SpO2: 99% 96% 97% 95%  Weight:      Height:        Intake/Output Summary (Last 24 hours) at 01/17/2017 1753 Last data filed at 01/17/2017 1405 Gross per 24 hour  Intake 1050 ml  Output 1300 ml  Net -250 ml   Filed Weights   01/11/17 1241 01/11/17 1959  Weight: 81.6 kg (180 lb) 84 kg (185 lb 3 oz)    Examination:  General: Pt is alert, awake, not in acute distress Cardiovascular: RRR, S1/S2 +, no rubs, no gallops.  Port in right chest/neck Respiratory: CTA bilaterally, no wheezing, no rhonchi Abdominal: Soft, NT, ND, bowel sounds +.  Ostomy with some air and scant serosanguinous fluid.  Midline incision with scant dark serosanguinous drainage.   GU:  Scrotum still swollen Extremities: no edema, no cyanosis     Data Reviewed: I have personally reviewed following labs and imaging studies  CBC: Recent Labs  Lab 01/11/17 1359 01/12/17 0404 01/15/17 0444 01/16/17 0358 01/17/17 0429  WBC 10.2 6.9 14.7* 12.4* 11.1*  HGB 14.0 12.8* 11.8* 11.3* 11.5*  HCT 39.2 37.2* 34.7* 32.8* 34.1*  MCV 82.2 83.0 83.8 84.5 84.2  PLT 382 359 337 322 106   Basic Metabolic Panel: Recent Labs  Lab 01/11/17 1359 01/12/17 0404 01/15/17 0444  NA 129* 135 136  K 4.0 3.9 4.0  CL 96* 105 104  CO2 22 23 27   GLUCOSE 117* 119* 153*  BUN 12 10 6   CREATININE 0.99 0.87 0.94  CALCIUM 8.7* 8.5* 8.1*   GFR: Estimated Creatinine Clearance: 73.4 mL/min (by C-G formula based on SCr of 0.94 mg/dL). Liver Function Tests: Recent Labs  Lab 01/11/17 1359 01/12/17 0404  AST 26 20  ALT 18 15*  ALKPHOS 77 67   BILITOT 0.5 0.6  PROT 7.0 6.2*  ALBUMIN 3.3* 3.1*   Recent Labs  Lab 01/11/17 1359  LIPASE 23   No results for input(s): AMMONIA in the last 168 hours. Coagulation Profile: No results for input(s): INR, PROTIME in the last 168 hours. Cardiac Enzymes: No results for input(s): CKTOTAL, CKMB, CKMBINDEX, TROPONINI in the last 168 hours. BNP (last 3 results) No results for input(s): PROBNP in the last 8760 hours. HbA1C: No results for input(s): HGBA1C in the last 72 hours. CBG: No results for input(s): GLUCAP in the last 168 hours. Lipid Profile: No results for input(s): CHOL, HDL, LDLCALC, TRIG, CHOLHDL, LDLDIRECT in the last 72 hours. Thyroid Function Tests: No results for input(s): TSH, T4TOTAL, FREET4, T3FREE, THYROIDAB in the  last 72 hours. Anemia Panel: No results for input(s): VITAMINB12, FOLATE, FERRITIN, TIBC, IRON, RETICCTPCT in the last 72 hours. Urine analysis:    Component Value Date/Time   COLORURINE STRAW (A) 01/11/2017 1600   APPEARANCEUR CLEAR 01/11/2017 1600   LABSPEC 1.017 01/11/2017 1600   PHURINE 7.0 01/11/2017 1600   GLUCOSEU NEGATIVE 01/11/2017 1600   HGBUR NEGATIVE 01/11/2017 1600   BILIRUBINUR NEGATIVE 01/11/2017 1600   KETONESUR 5 (A) 01/11/2017 1600   PROTEINUR NEGATIVE 01/11/2017 1600   NITRITE NEGATIVE 01/11/2017 1600   LEUKOCYTESUR NEGATIVE 01/11/2017 1600   Sepsis Labs: @LABRCNTIP (procalcitonin:4,lacticidven:4)  ) Recent Results (from the past 240 hour(s))  Surgical pcr screen     Status: None   Collection Time: 01/13/17  9:49 PM  Result Value Ref Range Status   MRSA, PCR NEGATIVE NEGATIVE Final   Staphylococcus aureus NEGATIVE NEGATIVE Final    Comment: (NOTE) The Xpert SA Assay (FDA approved for NASAL specimens in patients 14 years of age and older), is one component of a comprehensive surveillance program. It is not intended to diagnose infection nor to guide or monitor treatment.       Radiology Studies: Dg Chest 2  View  Result Date: 01/17/2017 CLINICAL DATA:  Postop for Port-A-Cath placement. EXAM: CHEST  2 VIEW COMPARISON:  CT 01/15/2017. Most recent chest radiograph 08/05/2005. FINDINGS: Right-sided Port-A-Cath terminates at the mid SVC. Midline trachea. Left larger than right pleural effusions, as before. No pneumothorax. Mild left base atelectasis. IMPRESSION: Appropriate position of right-sided Port-A-Cath, without pneumothorax. Left larger than right pleural effusions are similar and small. Adjacent left base atelectasis. Electronically Signed   By: Abigail Miyamoto M.D.   On: 01/17/2017 12:21   Dg C-arm 1-60 Min-no Report  Result Date: 01/17/2017 Fluoroscopy was utilized by the requesting physician.  No radiographic interpretation.     Scheduled Meds: . acetaminophen  1,000 mg Oral Q8H  . amLODipine  5 mg Oral QPM  . benazepril  10 mg Oral QPM  . enoxaparin (LOVENOX) injection  40 mg Subcutaneous Q24H   Continuous Infusions: . ceFAZolin    .  ceFAZolin (ANCEF) IV       LOS: 5 days    Time spent: 30 min    Janece Canterbury, MD [facility] Pager [phone]  If 7PM-7AM, please contact night-coverage [email or web address] Password [email or web address] 01/17/2017, 5:53 PM

## 2017-01-17 NOTE — Discharge Summary (Signed)
Physician Discharge Summary  Thomas Wagner TKZ:[id number] DOB: 06/02/1940 DOA: 01/11/2017  PCP: [name], MD  Admit date: 01/11/2017 Discharge date: 01/18/2017  Admitted From: home  Disposition:  home  Recommendations for Outpatient Follow-up:  1. Follow up with Dr. Burr Medico 2. Follow up with General Surgery in 2 weeks for ostomy check  Home Health:  none  Equipment/Devices:  Ostomy supplies  Discharge Condition:  Stable, improved CODE STATUS:  Full code  Diet recommendation:  Regular diet   Brief/Interim Summary:  The patient is a 76 yo M with history of HTN, prediabetes, Penile Squamous Cell Carcinoma Dx 01/03/2014 (seen byDr. Kathie [facility])who presented with decreased appetite, nausea, abdominal pain for the last 2-3 weeks. He was found to have an obstructing colorectal mass. He underwent colonoscopy with biopsy which revealed adenocarcinoma with signet ring cells.  He underwent resection with colostomy placement, creation of Hartmann's pouch, and biopsies of his omentum on 11/16.  He has recovered quickly from surgery.  He underwent port placement on 11/19 and was discharged home to follow up with oncology in 1-2 weeks to discuss treatment options for his malignancy.     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   -  Colonscopy with biopsy on 11/15:  Adenocarcinoma with signet ring cells  - Dr. Leighton Ruff performedcolon resection on 11/16 -  CEA 3.2 -  CT chest:  No evidence of mets -  Cytology from ascitic fluid and surgical pathology from colon resection are still pending -  PORT placed on 11/19 -  Patient to follow up with  Oncology, Dr. Burr Medico in 2 weeks after discharge  Squamous cell carcinoma penis -Diagnosed 11/5.ConsultDr. Kathie Rhodes Urologyafter patient's colon cancer issues resolved. -Although, Dr. Kathie Rhodes Urologywanted patient to see oncology specialist in [facility], will not be stable for several weeks.   Nausea, vomiting due to obstruction, resolved.   -  Tolerating a soft diet  Hyponatremia:  -Resolved  Essential HTN, BP stable -Amlodipine 2.5 mgdaily -Lotensin 10 mgdaily   Leukocytosis, likely reactive from surgery, no signs of infection, trending down  Mild normocytic anemia likely due to cancer and recent surgery, hgb approximately stable   Discharge Instructions  Discharge Instructions    Call MD for:  difficulty breathing, headache or visual disturbances   Complete by:  As directed    Call MD for:  persistant dizziness or light-headedness   Complete by:  As directed    Call MD for:  persistant nausea and vomiting   Complete by:  As directed    Call MD for:  redness, tenderness, or signs of infection (pain, swelling, redness, odor or green/yellow discharge around incision site)   Complete by:  As directed    Call MD for:  severe uncontrolled pain   Complete by:  As directed    Call MD for:  temperature >100.4   Complete by:  As directed    Diet general   Complete by:  As directed    Discharge instructions   Complete by:  As directed    Please stop taking your oral antibiotics.  Also, please stop taking any herbs or supplements you may have been taking.  Some herbs  and supplements can interfere with chemotherapy, so please do not resume these until you have discussed them with your oncologist.  Also, I have increased your norvasc (amlodipine) slightly because your blood pressure was a little high.   Increase activity slowly   Complete by:  As directed        Medication List    STOP taking these medications   cephALEXin 500 MG capsule Commonly known as:  KEFLEX    CINNAMON PO   ciprofloxacin 500 MG tablet Commonly known as:  CIPRO   GARLIC PO   metroNIDAZOLE 500 MG tablet Commonly known as:  FLAGYL     TAKE these medications   amLODipine 5 MG tablet Commonly known as:  NORVASC Take 2.5 mg by mouth every evening.   benazepril 20 MG tablet Commonly known as:  LOTENSIN Take 10 mg by mouth every evening.   ibuprofen 200 MG tablet Commonly known as:  ADVIL,MOTRIN Take 400 mg 2 (two) times daily as needed by mouth.   oxycodone 5 MG capsule Commonly known as:  OXY-IR Take 1-2 capsules (5-10 mg total) every 4 (four) hours as needed by mouth.      Follow-up Information    [name], MD Follow up.   Specialty:  Family Medicine Why:  as needed Contact information: [place] [address] [phone]        Truitt Merle, MD. Schedule an appointment as soon as possible for a visit in 10 day(s).   Specialties:  Hematology, Oncology Contact information: [place] [address] [phone]        Leighton Ruff, MD. Schedule an appointment as soon as possible for a visit in 2 week(s).   Specialty:  General Surgery Why:  follow up of ostomy surgery Contact information: [place]  [address] [phone]          No Known Allergies  Consultations: [facility], Dr. Loletha Carrow Oncology, Dr. Burr Medico General Surgery, Dr. Marcello Moores   Procedures/Studies: Dg Chest 2 View  Result Date: 01/17/2017 CLINICAL DATA:  Postop for Port-A-Cath placement. EXAM: CHEST  2 VIEW COMPARISON:  CT 01/15/2017. Most recent chest radiograph 08/05/2005. FINDINGS: Right-sided Port-A-Cath terminates at the mid SVC. Midline trachea. Left larger than right pleural effusions, as before. No pneumothorax. Mild left base atelectasis. IMPRESSION: Appropriate position of right-sided Port-A-Cath, without pneumothorax. Left larger than right pleural effusions are similar and small.  Adjacent left base atelectasis. Electronically Signed   By: Abigail Miyamoto M.D.   On: 01/17/2017 12:21   Ct Chest W Contrast  Result Date: 01/15/2017 CLINICAL DATA:  Status post resection of obstructing sigmoid colonic adenocarcinoma. EXAM: CT CHEST WITH CONTRAST TECHNIQUE: Multidetector CT imaging of the chest was performed during intravenous contrast administration. CONTRAST:  75 mL ISOVUE-300 IV COMPARISON:  CT of the abdomen and pelvis on 01/11/2017 FINDINGS: Cardiovascular: The heart size is normal. The thoracic aorta shows no evidence of aneurysmal disease. Calcified coronary artery plaque identified in a 3 vessel distribution. No pericardial fluid. Central pulmonary arteries are normal in caliber. Mediastinum/Nodes: Tiny mediastinal lymph nodes are present. No enlarged mediastinal, axillary or hilar lymphadenopathy identified. Lungs/Pleura: No pulmonary nodules or airspace consolidation. No pulmonary edema. Small left pleural effusion and trace right pleural effusion. No obvious pleural nodularity. Upper Abdomen: Small amount of free intraperitoneal air identified in the upper abdomen related to recent abdominal surgery. Musculoskeletal: No bony lesions or fractures. IMPRESSION: 1. No evidence of metastatic disease in the chest. 2.  Small left pleural effusion and trace right pleural effusion without obvious pleural metastatic disease. 3. Coronary atherosclerosis in a 3 vessel distribution. 4. Free intraperitoneal air in the upper abdomen related to recent abdominal surgery. Electronically Signed   By: Aletta Edouard M.D.   On: 01/15/2017 14:58   Ct Abdomen Pelvis W Contrast  Result Date: 01/11/2017 CLINICAL DATA:  Abdominal pain, cramping, bloating and diarrhea for 1 month. EXAM: CT ABDOMEN AND PELVIS WITH CONTRAST TECHNIQUE: Multidetector CT imaging of the abdomen and pelvis was performed using the standard protocol following bolus administration of intravenous contrast. CONTRAST:  160mL ISOVUE-300  IOPAMIDOL (ISOVUE-300) INJECTION 61% COMPARISON:  None. FINDINGS: Lower chest: Small dependent left pleural effusion. Mild dependent left lower lobe atelectasis. Coronary atherosclerosis. Hepatobiliary: Normal liver with no liver mass. Normal gallbladder with no radiopaque cholelithiasis. No biliary ductal dilatation. Pancreas: Normal, with no mass or duct dilation. Spleen: Normal size. No mass. Adrenals/Urinary Tract: No discrete adrenal nodules. No hydronephrosis. Small simple parapelvic renal cysts in both kidneys. No suspicious renal masses. Normal bladder. Stomach/Bowel: Small hiatal hernia. Otherwise normal nondistended stomach. Normal caliber small bowel with no small bowel wall thickening. Normal appendix. There is an irregular annular mass in the sigmoid colon measuring approximately 6.2 x 3.6 cm (series 2/ image 79). There is moderate left colonic diverticulosis. No significant large bowel dilatation. No additional sites of large bowel wall thickening. Vascular/Lymphatic: Atherosclerotic nonaneurysmal abdominal aorta. Patent portal, splenic, hepatic and renal veins. There are several clustered mildly enlarged lymph nodes in the sigmoid mesentery measuring up to 1.4 cm (series 2/ image 75). Reproductive: Top-normal size prostate. Other: There is extensive soft tissue caking throughout the omentum. Moderate volume ascites. Smooth peritoneal thickening and enhancement throughout the peritoneal cavity. Musculoskeletal: No aggressive appearing focal osseous lesions. Marked thoracolumbar spondylosis. IMPRESSION: 1. Irregular annular 6.2 x 3.6 cm mass in the sigmoid colon suspicious for primary colonic malignancy. 2. Multiple mildly enlarged lymph nodes in the sigmoid mesenteries suspicious for locoregional nodal metastases. 3. Extensive soft tissue caking throughout the omentum. Smooth peritoneal thickening and enhancement throughout the peritoneal cavity. Moderate volume ascites. 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 4. Small dependent left pleural effusion. 5.  Aortic Atherosclerosis (ICD10-I70.0).  Coronary atherosclerosis. Electronically Signed   By: Ilona Sorrel M.D.   On: 01/11/2017 16:31   Dg C-arm 1-60 Min-no Report  Result Date: 01/17/2017 Fluoroscopy was utilized by the requesting physician.  No radiographic interpretation.   Subjective: Feeling well.  Denies SOB, abdominal pains.  Not much out through ostomy other than air yet.  Scrotum is still swollen.    Discharge Exam: Vitals:   01/18/17 0606 01/18/17 1400  BP: 118/67 (!) 150/78  Pulse: 81 94  Resp: 18 18  Temp: 99.1 F (37.3 C) 98.9 F (37.2 C)  SpO2: 100% 96%   Vitals:   01/17/17 2054 01/18/17 0158 01/18/17 0606 01/18/17 1400  BP: 129/71 119/65 118/67 (!) 150/78  Pulse: 96 83 81 94  Resp: 18 18 18 18   Temp: 98.8 F (37.1 C) 98.8 F (37.1 C) 99.1 F (37.3 C) 98.9 F (37.2 C)  TempSrc: Oral Oral Oral Oral  SpO2: 97% 98% 100% 96%  Weight:      Height:        General: Pt is alert, awake, not in acute distress Cardiovascular: RRR, S1/S2 +, no rubs, no gallops.  Port in right chest/neck Respiratory: CTA bilaterally, no wheezing, no rhonchi Abdominal: Soft, NT, ND, bowel sounds +.  Ostomy with  some air and scant serosanguinous fluid.  Midline incision with scant dark serosanguinous drainage.   GU:  Scrotum still swollen Extremities: no edema, no cyanosis    The results of significant diagnostics from this hospitalization (including imaging, microbiology, ancillary and laboratory) are listed below for reference.     Microbiology: Recent Results (from the past 240 hour(s))  Surgical pcr screen     Status: None   Collection Time: 01/13/17  9:49 PM  Result Value Ref Range Status   MRSA, PCR NEGATIVE NEGATIVE Final   Staphylococcus aureus NEGATIVE NEGATIVE Final    Comment: (NOTE) The Xpert SA Assay (FDA approved for NASAL specimens in patients 37 years of age and older), is  one component of a comprehensive surveillance program. It is not intended to diagnose infection nor to guide or monitor treatment.      Labs: BNP (last 3 results) No results for input(s): BNP in the last 8760 hours. Basic Metabolic Panel: Recent Labs  Lab 01/12/17 0404 01/15/17 0444  NA 135 136  K 3.9 4.0  CL 105 104  CO2 23 27  GLUCOSE 119* 153*  BUN 10 6  CREATININE 0.87 0.94  CALCIUM 8.5* 8.1*   Liver Function Tests: Recent Labs  Lab 01/12/17 0404  AST 20  ALT 15*  ALKPHOS 67  BILITOT 0.6  PROT 6.2*  ALBUMIN 3.1*   No results for input(s): LIPASE, AMYLASE in the last 168 hours. No results for input(s): AMMONIA in the last 168 hours. CBC: Recent Labs  Lab 01/12/17 0404 01/15/17 0444 01/16/17 0358 01/17/17 0429 01/18/17 0506  WBC 6.9 14.7* 12.4* 11.1* 7.5  HGB 12.8* 11.8* 11.3* 11.5* 11.4*  HCT 37.2* 34.7* 32.8* 34.1* 34.1*  MCV 83.0 83.8 84.5 84.2 84.0  PLT 359 337 322 320 330   Cardiac Enzymes: No results for input(s): CKTOTAL, CKMB, CKMBINDEX, TROPONINI in the last 168 hours. BNP: Invalid input(s): POCBNP CBG: No results for input(s): GLUCAP in the last 168 hours. D-Dimer No results for input(s): DDIMER in the last 72 hours. Hgb A1c No results for input(s): HGBA1C in the last 72 hours. Lipid Profile No results for input(s): CHOL, HDL, LDLCALC, TRIG, CHOLHDL, LDLDIRECT in the last 72 hours. Thyroid function studies No results for input(s): TSH, T4TOTAL, T3FREE, THYROIDAB in the last 72 hours.  Invalid input(s): FREET3 Anemia work up No results for input(s): VITAMINB12, FOLATE, FERRITIN, TIBC, IRON, RETICCTPCT in the last 72 hours. Urinalysis    Component Value Date/Time   COLORURINE STRAW (A) 01/11/2017 1600   APPEARANCEUR CLEAR 01/11/2017 1600   LABSPEC 1.017 01/11/2017 1600   PHURINE 7.0 01/11/2017 1600   GLUCOSEU NEGATIVE 01/11/2017 1600   HGBUR NEGATIVE 01/11/2017 1600   BILIRUBINUR NEGATIVE 01/11/2017 1600   KETONESUR 5 (A)  01/11/2017 1600   PROTEINUR NEGATIVE 01/11/2017 1600   NITRITE NEGATIVE 01/11/2017 1600   LEUKOCYTESUR NEGATIVE 01/11/2017 1600   Sepsis Labs Invalid input(s): PROCALCITONIN,  WBC,  LACTICIDVEN   Time coordinating discharge: Over 30 minutes  SIGNED:   Janece Canterbury, MD  [facility] 01/18/2017, 4:00 PM Pager   If 7PM-7AM, please contact night-coverage [email or web address] Password [email or web address]

## 2017-01-17 NOTE — Anesthesia Postprocedure Evaluation (Signed)
Anesthesia Post Note  Patient: Thomas Wagner  Procedure(s) Performed: INSERTION PORT-A-CATH (N/A Chest)     Patient location during evaluation: PACU Anesthesia Type: General Level of consciousness: awake and alert Pain management: pain level controlled Vital Signs Assessment: post-procedure vital signs reviewed and stable Respiratory status: spontaneous breathing, nonlabored ventilation and respiratory function stable Cardiovascular status: blood pressure returned to baseline and stable Postop Assessment: no apparent nausea or vomiting Anesthetic complications: no    Last Vitals:  Vitals:   01/17/17 1145 01/17/17 1200  BP:  (!) 148/87  Pulse: 88 93  Resp: (!) 21 18  Temp:  36.8 C  SpO2: 100% 99%    Last Pain:  Vitals:   01/17/17 1110  TempSrc:   PainSc: 0-No pain                 [name]

## 2017-01-17 NOTE — Anesthesia Procedure Notes (Signed)
Procedure Name: LMA Insertion Date/Time: 01/17/2017 9:48 AM Performed by: [place] Lizzete Gough C, CRNA Pre-anesthesia Checklist: Patient identified, Emergency Drugs available, Suction available and Patient being monitored Patient Re-evaluated:Patient Re-evaluated prior to induction Oxygen Delivery Method: Circle system utilized Preoxygenation: Pre-oxygenation with 100% oxygen Induction Type: IV induction Ventilation: Mask ventilation without difficulty LMA: LMA inserted LMA Size: 5.0 Number of attempts: 1 Airway Equipment and Method: Bite block Placement Confirmation: positive ETCO2 Tube secured with: Tape Dental Injury: Teeth and Oropharynx as per pre-operative assessment

## 2017-01-17 NOTE — Op Note (Signed)
Preoperative diagnosis: Cancer of the colon and  poor venous access  Postoperative diagnosis: Same  Procedure: Placement of ClearVue subcutaneous venous port with US guidance  Surgeon: Excell Seltzer M.D.  Anesthesia: LMA general  Description of procedure: Patient is brought to the operating room and placed in the supine position on the operating table. LMA general anesthesia was administered. The entire upper chest and neck were widely sterilely prepped and draped. [place] was used to visualize the right IJ vein. Local anesthesia was used to infiltrate the insertion of and port sites. The right IJ  vein was cannulated under continuous ultrasound visualization with a needle and guidewire without difficulty and position in the superior vena cava was confirmed by fluoroscopy. The introducer was then placed over the guidewire and the flushed catheter placed via the introducer which was stripped away and the tip of the catheter positioned near the cavoatrial junction. Venous pressure backflow was confirmed in the catheter. A small transverse incision was made in the anterior chest wall and subcutaneous pocket created. The catheter was tunneled into the pocket, trimmed to length, and attached to the flushed port which was positioned in the pocket. The port was sutured to the chest wall with interrupted 2-0 Prolene. The incisions were closed with subcutaneous interrupted Monocryl and the skin incisions closed with subcuticular Monocryl and Dermabond. The port was accessed and flushed and aspirated easily and was left flushed with concentrated heparin solution. Sponge needle as the counts were correct. The patient was taken to recovery in good condition.  Darene Lamer Naquita Nappier  01/17/2017

## 2017-01-17 NOTE — Transfer of Care (Signed)
Immediate Anesthesia Transfer of Care Note  Patient: Thomas Wagner  Procedure(s) Performed: INSERTION PORT-A-CATH (N/A Chest)  Patient Location: PACU  Anesthesia Type:General  Level of Consciousness: awake, alert  and oriented  Airway & Oxygen Therapy: Patient Spontanous Breathing and Patient connected to face mask oxygen  Post-op Assessment: Report given to RN and Post -op Vital signs reviewed and stable  Post vital signs: Reviewed and stable  Last Vitals:  Vitals:   01/16/17 2150 01/17/17 0600  BP: 118/81 128/78  Pulse: 90 84  Resp: 18 18  Temp: 37.2 C 37.1 C  SpO2: 96% 94%    Last Pain:  Vitals:   01/17/17 0600  TempSrc: Oral  PainSc:       Patients Stated Pain Goal: 2 (78/58/85 0277)  Complications: No apparent anesthesia complications

## 2017-01-17 NOTE — Progress Notes (Signed)
Patient ID: Thomas Wagner, male   DOB: Nov 14, 1940, 76 y.o.   MRN: [id number] 3 Days Post-Op   Subjective: Complaining of some swelling at his scrotum.  No abdominal pain.  Tolerating regular diet.  Objective: Vital signs in last 24 hours: Temp:  [98.5 F (36.9 C)-98.9 F (37.2 C)] 98.8 F (37.1 C) (11/19 0600) Pulse Rate:  [84-90] 84 (11/19 0600) Resp:  [17-18] 18 (11/19 0600) BP: (118-128)/(70-81) 128/78 (11/19 0600) SpO2:  [94 %-98 %] 94 % (11/19 0600) Last BM Date: 01/13/17  Intake/Output from previous day: 11/18 0701 - 11/19 0700 In: 445 [P.O.:120; I.V.:325] Out: 1225 [Urine:1225] Intake/Output this shift: No intake/output data recorded.  General appearance: alert, cooperative and no distress Resp: clear to auscultation bilaterally GI: normal findings: soft, non-tender and Healthy colostomy left lower quadrant Male genitalia: normal, Circumcision wound with some eschar but no erythema or unusual drainage.  Scrotum moderately edematous with some ecchymosis. Incision/Wound: No erythema or drainage  Lab Results:  Recent Labs    01/16/17 0358 01/17/17 0429  WBC 12.4* 11.1*  HGB 11.3* 11.5*  HCT 32.8* 34.1*  PLT 322 320   BMET Recent Labs    01/15/17 0444  NA 136  K 4.0  CL 104  CO2 27  GLUCOSE 153*  BUN 6  CREATININE 0.94  CALCIUM 8.1*     Studies/Results: Ct Chest W Contrast  Result Date: 01/15/2017 CLINICAL DATA:  Status post resection of obstructing sigmoid colonic adenocarcinoma. EXAM: CT CHEST WITH CONTRAST TECHNIQUE: Multidetector CT imaging of the chest was performed during intravenous contrast administration. CONTRAST:  75 mL ISOVUE-300 IV COMPARISON:  CT of the abdomen and pelvis on 01/11/2017 FINDINGS: Cardiovascular: The heart size is normal. The thoracic aorta shows no evidence of aneurysmal disease. Calcified coronary artery plaque identified in a 3 vessel distribution. No pericardial fluid. Central pulmonary arteries are normal in caliber.  Mediastinum/Nodes: Tiny mediastinal lymph nodes are present. No enlarged mediastinal, axillary or hilar lymphadenopathy identified. Lungs/Pleura: No pulmonary nodules or airspace consolidation. No pulmonary edema. Small left pleural effusion and trace right pleural effusion. No obvious pleural nodularity. Upper Abdomen: Small amount of free intraperitoneal air identified in the upper abdomen related to recent abdominal surgery. Musculoskeletal: No bony lesions or fractures. IMPRESSION: 1. No evidence of metastatic disease in the chest. 2. Small left pleural effusion and trace right pleural effusion without obvious pleural metastatic disease. 3. Coronary atherosclerosis in a 3 vessel distribution. 4. Free intraperitoneal air in the upper abdomen related to recent abdominal surgery. Electronically Signed   By: Aletta Edouard M.D.   On: 01/15/2017 14:58    Anti-infectives: Anti-infectives (From admission, onward)   Start     Dose/Rate Route Frequency Ordered Stop   01/14/17 1046  cefoTEtan in Dextrose 5% (CEFOTAN) 2-2.08 GM-%(50ML) IVPB    Comments:  Bridget Hartshorn   : cabinet override      01/14/17 1046 01/14/17 2259   01/14/17 0600  cefoTEtan (CEFOTAN) 2 g in dextrose 5 % 50 mL IVPB  Status:  Discontinued     2 g 100 mL/hr over 30 Minutes Intravenous On call to O.R. 01/13/17 1427 01/13/17 1437   01/14/17 0600  cefoTEtan (CEFOTAN) 2 g in dextrose 5 % 50 mL IVPB  Status:  Discontinued     2 g 100 mL/hr over 30 Minutes Intravenous On call to O.R. 01/13/17 1438 01/13/17 1438   01/14/17 0600  cefoTEtan (CEFOTAN) 2 g in dextrose 5 % 50 mL IVPB     2  g 100 mL/hr over 30 Minutes Intravenous On call to O.R. 01/13/17 1439 01/14/17 1129      Assessment/Plan: Obstructing colon cancer with carcinomatosis s/p Procedure(s): LAPAROSCOPIC SIGMOIDECTOMY , OMENTECTOMY BIOPSY WITH END COLOSTOMY Some scrotal edema and ecchymosis secondary to lower abdominal incision which should resolve spontaneously He will  need Port-A-Cath for long-term venous access for chemotherapy.  I discussed the procedure with the patient including the nature of the surgery and indications as well as risks of anesthetic complications, bleeding, infection, vascular or lung injury and long-term risks of thrombosis, infection, displacement and occlusion.  All questions answered and he agrees to proceed.   LOS: 5 days    Edward Jolly 01/17/2017

## 2017-01-18 DIAGNOSIS — Z95828 Presence of other vascular implants and grafts: Secondary | ICD-10-CM

## 2017-01-18 DIAGNOSIS — C186 Malignant neoplasm of descending colon: Secondary | ICD-10-CM

## 2017-01-18 LAB — CBC
HEMATOCRIT: 34.1 % — AB (ref 39.0–52.0)
Hemoglobin: 11.4 g/dL — ABNORMAL LOW (ref 13.0–17.0)
MCH: 28.1 pg (ref 26.0–34.0)
MCHC: 33.4 g/dL (ref 30.0–36.0)
MCV: 84 fL (ref 78.0–100.0)
PLATELETS: 330 10*3/uL (ref 150–400)
RBC: 4.06 MIL/uL — ABNORMAL LOW (ref 4.22–5.81)
RDW: 13.2 % (ref 11.5–15.5)
WBC: 7.5 10*3/uL (ref 4.0–10.5)

## 2017-01-18 MED ORDER — ENSURE ENLIVE PO LIQD
237.0000 mL | Freq: Two times a day (BID) | ORAL | Status: DC
  Administered 2017-01-18: 237 mL via ORAL

## 2017-01-18 NOTE — Progress Notes (Signed)
Reviewed AVS and discharge instructions with pt and wife. They verbalized understanding and agreed to contact us with any questions or concerns. Pt discharged home with wife in stable condition with pain controlled and all belongings via English.

## 2017-01-18 NOTE — Consult Note (Signed)
[facility] Nurse ostomy follow up Stoma type/location: LLQ colostomy Stomal assessment/size: 1 and 5/8 inches Peristomal assessment: not seen today.  Pouching system applied yesterday is intact. Treatment options for stomal/peristomal skin: Skin barrier ring Output: flatus, no stool Ostomy pouching: 2pc. 2 and 3/4 inch pouching system with skin barrier ring. Education provided: Patient expressed appreciation for extensive education session yesterday with his family. Will practice simulated pouch emptying for now. Enrolled patient in [facility] Discharge program: Yes HHRN recommended. [facility] nursing team will follow, and will remain available to this patient, the nursing and medical teams.  Thanks, Maudie Flakes, MSN, RN, [place], Arther Abbott  Pager# [phone]

## 2017-01-18 NOTE — Discharge Instructions (Signed)
[facility]      [facility] °[phone] ° °OPEN ABDOMINAL SURGERY: POST OP INSTRUCTIONS ° °Always review your discharge instruction sheet given to you by the facility where your surgery was performed. ° °IF YOU HAVE DISABILITY OR FAMILY LEAVE FORMS, YOU MUST BRING THEM TO THE OFFICE FOR PROCESSING.  PLEASE DO NOT GIVE THEM TO YOUR DOCTOR. ° °1. A prescription for pain medication may be given to you upon discharge.  Take your pain medication as prescribed, if needed.  If narcotic pain medicine is not needed, then you may take acetaminophen (Tylenol) or ibuprofen (Advil) as needed. °2. Take your usually prescribed medications unless otherwise directed. °3. If you need a refill on your pain medication, please contact your pharmacy. They will contact our office to request authorization.  Prescriptions will not be filled after 5pm or on week-ends. °4. You should follow a light diet the first few days after arrival home, such as soup and crackers, pudding, etc.unless your doctor has advised otherwise. A high-fiber, low fat diet can be resumed as tolerated.   Be sure to include lots of fluids daily. Most patients will experience some swelling and bruising on the chest and neck area.  Ice packs will help.  Swelling and bruising can take several days to resolve °5. Most patients will experience some swelling and bruising in the area of the incision. Ice pack will help. Swelling and bruising can take several days to resolve..  °6. It is common to experience some constipation if taking pain medication after surgery.  Increasing fluid intake and taking a stool softener will usually help or prevent this problem from occurring.  A mild laxative (Milk of Magnesia or Miralax) should be taken according to package directions if there are no bowel movements after 48 hours. °7.  You may have steri-strips (small skin tapes) in place directly over the incision.  These strips should be left on the skin for 7-10 days.  If your  surgeon used skin glue on the incision, you may shower in 24 hours.  The glue will flake off over the next 2-3 weeks.  Any sutures or staples will be removed at the office during your follow-up visit. You may find that a light gauze bandage over your incision may keep your staples from being rubbed or pulled. You may shower and replace the bandage daily. °8. ACTIVITIES:  You may resume regular (light) daily activities beginning the next day--such as daily self-care, walking, climbing stairs--gradually increasing activities as tolerated.  You may have sexual intercourse when it is comfortable.  Refrain from any heavy lifting or straining until approved by your doctor. °a. You may drive when you no longer are taking prescription pain medication, you can comfortably wear a seatbelt, and you can safely maneuver your car and apply brakes °b. Return to Work: ___________________________________ °9. You should see your doctor in the office for a follow-up appointment approximately two weeks after your surgery.  Make sure that you call for this appointment within a day or two after you arrive home to insure a convenient appointment time. °OTHER INSTRUCTIONS:  °_____________________________________________________________ °_____________________________________________________________ ° °WHEN TO CALL YOUR DOCTOR: °1. Fever over 101.0 °2. Inability to urinate °3. Nausea and/or vomiting °4. Extreme swelling or bruising °5. Continued bleeding from incision. °6. Increased pain, redness, or drainage from the incision. °7. Difficulty swallowing or breathing °8. Muscle cramping or spasms. °9. Numbness or tingling in hands or feet or around lips. ° °The clinic staff is available to   answer your questions during regular business hours.  Please don’t hesitate to call and ask to speak to one of the nurses if you have concerns. ° °For further questions, please visit [email or web address] ° ° °Colostomy, Adult, Care After °Refer to  this sheet in the next few weeks. These instructions provide you with information about caring for yourself after your procedure. Your health care provider may also give you more specific instructions. Your treatment has been planned according to current medical practices, but problems sometimes occur. Call your health care provider if you have any problems or questions after your procedure. °What can I expect after the procedure? °After the procedure, it is common to have: °· Swelling at the opening that was created during the procedure (stoma). °· Slight bleeding around the stoma. °· Redness around the stoma. ° °Follow these instructions at home: °Activity °· Rest as needed while the stoma area heals. °· Return to your normal activities as told by your health care provider. Ask your health care provider what activities are safe for you. °· Avoid strenuous activity and abdominal exercises for 3 weeks or for as long as told by your health care provider. °· Do not lift anything that is heavier than 10 lb (4.5 kg). °Incision care ° °· Follow instructions from your health care provider about how to take care of your incision. Make sure you: °? Wash your hands with soap and water before you change your bandage (dressing). If soap and water are not available, use hand sanitizer. °? Change your dressing as told by your health care provider. °? Leave stitches (sutures), skin glue, or adhesive strips in place. These skin closures may need to stay in place for 2 weeks or longer. If adhesive strip edges start to loosen and curl up, you may trim the loose edges. Do not remove adhesive strips completely unless your health care provider tells you to do that. °Stoma Care °· Keep the stoma area clean. °· Clean and dry the skin around the stoma each time you change the colostomy bag. To clean the stoma area: °? Use warm water and only use cleansers that are recommended by your health care provider. °? Rinse the stoma area with  plain water. °? Dry the area well. °· Use stoma powder or ointment on your skin only as told by your health care provider. Do not use any other powders, gels, wipes, or creams on your skin. °· Check the stoma area every day for signs of infection. Check for: °? More redness, swelling, or pain. °? More fluid or blood. °? Pus or warmth. °· Measure the stoma opening regularly and record the size. Watch for changes. Share this information with your health care provider. °Bathing °· Do not take baths, swim, or use a hot tub until your health care provider approves. Ask your health care provider if you can take showers. You may be able to shower with or without the colostomy bag in place. If you bathe with the bag on, dry the bag afterward. °· Avoid using harsh or oily soaps when you bathe. °Colostomy Bag Care °· Follow instructions from your health care provider about how to empty or change the colostomy bag. °· Keep colostomy supplies with you at all times. °· Store all supplies in a cool, dry place. °· Empty the colostomy bag: °? Whenever it is one-third to one-half full. °? At bedtime. °· Replace the bag every 2-4 days or as told by your health care provider. °Driving °·   Do not drive for 24 hours if you received a sedative. °· Do not drive or operate heavy machinery while taking prescription pain medicine. °General instructions °· Follow instructions from your health care provider about eating or drinking restrictions. °· Take over-the-counter and prescription medicines only as told by your health care provider. °· Avoid wearing clothes that are tight directly over your stoma. °· Do not use any tobacco products, such as cigarettes, chewing tobacco, and e-cigarettes. If you need help quitting, ask your health care provider. °· (Women) Ask your health care provider about becoming pregnant and about using birth control. Medicines may not be absorbed normally after the procedure. °· Keep all follow-up visits as told by  your health care provider. This is important. °Contact a health care provider if: °· You are having trouble caring for your stoma or changing the colostomy bag. °· You feel nauseous or you vomit. °· You have a fever. °· You have more redness, swelling, or pain at the site of your stoma or around your anus. °· You have more fluid or blood coming from your stoma or your anus. °· Your stoma area feels warm to the touch. °· You have pus coming from your stoma. °· You notice a change in the size or appearance of the stoma. °· You have abdominal pain, bloating, pressure, or cramping. °· Your have stool more often or less often than your health care provider tells you to expect. °· You are not making much urine. This may be a sign of dehydration. °Get help right away if: °· Your abdominal pain does not go away or it becomes severe. °· You keep vomiting. °· Your stool is not draining through the stoma. °· You have chest pain or an irregular heartbeat. °This information is not intended to replace advice given to you by your health care provider. Make sure you discuss any questions you have with your health care provider. °Document Released: 07/08/2010 Document Revised: 06/26/2015 Document Reviewed: 10/29/2014 °Elsevier Interactive Patient Education © 2018 [employer] ° ° °

## 2017-01-18 NOTE — Progress Notes (Signed)
Discharge planning, spoke with patient and spouse at beside. Chose [facility] for [facility] services, contacted [facility] for referral. Anticipating d/c later today, ostomy started functioning per patient. [phone]

## 2017-01-18 NOTE — Progress Notes (Signed)
Nutrition Follow-up  INTERVENTION:   -Provide Ensure Enlive po BID, each supplement provides 350 kcal and 20 grams of protein -RD will continue to monitor   NUTRITION DIAGNOSIS:   Inadequate oral intake related to inability to eat as evidenced by NPO status.  Now on regular diet. Resolved.  GOAL:   Patient will meet greater than or equal to 90% of their needs  Progressing.  MONITOR:   PO intake, Supplement acceptance, Weight trends, Labs, I & O's  ASSESSMENT:   76 y.o. male with medical history significant of hypertension, squamous cell carcinoma presenting with decreased appetite, nausea, abdominal pain.  Pt has been consuming 50-70% of meals when on a regular diet. Pt did not consume a lot yesterday d/t colon resection and colostomy. Pt currently awaiting output from colostomy, then per surgery pt will discharge.  Labs reviewed. Medications reviewed.  Diet Order:  Diet regular Room service appropriate? Yes; Fluid consistency: Thin Diet general  EDUCATION NEEDS:   No education needs have been identified at this time  Skin:  Skin Assessment: Reviewed RN Assessment  Last BM:  11/15  Height:   Ht Readings from Last 1 Encounters:  01/11/17 6' (1.829 m)    Weight:   Wt Readings from Last 1 Encounters:  01/11/17 185 lb 3 oz (84 kg)    Ideal Body Weight:  80.9 kg  BMI:  Body mass index is 25.12 kg/m.  Estimated Nutritional Needs:   Kcal:  2000-2200  Protein:  100-110g  Fluid:  2L/day  Clayton Bibles, MS, RD, LDN [facility] Pager: [phone] After Hours Pager: [phone]

## 2017-01-18 NOTE — Progress Notes (Signed)
Patient ID: Thomas Wagner, male   DOB: 1940-07-23, 76 y.o.   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 No issues with urinating. From an abdominal standpoint seems to be doing well. Only taking 1 tablet oxycodone daily. Tolerating regular diet. States that he is having a lot of air from his colostomy, but no stool yet.  Objective: Vital signs in last 24 hours: Temp:  [98.1 F (36.7 C)-99.1 F (37.3 C)] 99.1 F (37.3 C) (11/20 0606) Pulse Rate:  [81-98] 81 (11/20 0606) Resp:  [17-21] 18 (11/20 0606) BP: (118-148)/(65-87) 118/67 (11/20 0606) SpO2:  [95 %-100 %] 100 % (11/20 0606) Last BM Date: 01/13/17  Intake/Output from previous day: 11/19 0701 - 11/20 0700 In: 1050 [I.V.:1050] Out: 750 [Urine:750] Intake/Output this shift: No intake/output data recorded.  PE: Gen:  Alert, NAD, pleasant HEENT: EOM's intact, pupils equal and round Pulm: effort normal Abd: Soft, NT/ND, +BS in all 4 quadrants, midline incision C/D/I with honeycomb dressing in place, colostomy pink with air but no stool in bag Psych: A&Ox3  Skin: no rashes noted, warm and dry  Lab Results:  Recent Labs    01/17/17 0429 01/18/17 0506  WBC 11.1* 7.5  HGB 11.5* 11.4*  HCT 34.1* 34.1*  PLT 320 330   BMET No results for input(s): NA, K, CL, CO2, GLUCOSE, BUN, CREATININE, CALCIUM in the last 72 hours. PT/INR No results for input(s): LABPROT, INR in the last 72 hours. CMP     Component Value Date/Time   NA 136 01/15/2017 0444   K 4.0 01/15/2017 0444   CL 104 01/15/2017 0444   CO2 27 01/15/2017 0444   GLUCOSE 153 (H) 01/15/2017 0444   BUN 6 01/15/2017 0444   CREATININE 0.94 01/15/2017 0444   CALCIUM 8.1 (L) 01/15/2017 0444   PROT 6.2 (L) 01/12/2017 0404   ALBUMIN 3.1 (L) 01/12/2017 0404   AST 20 01/12/2017 0404   ALT 15 (L) 01/12/2017 0404   ALKPHOS 67  01/12/2017 0404   BILITOT 0.6 01/12/2017 0404   GFRNONAA >60 01/15/2017 0444   GFRAA >60 01/15/2017 0444   Lipase     Component Value Date/Time   LIPASE 23 01/11/2017 1359       Studies/Results: Dg Chest 2 View  Result Date: 01/17/2017 CLINICAL DATA:  Postop for Port-A-Cath placement. EXAM: CHEST  2 VIEW COMPARISON:  CT 01/15/2017. Most recent chest radiograph 08/05/2005. FINDINGS: Right-sided Port-A-Cath terminates at the mid SVC. Midline trachea. Left larger than right pleural effusions, as before. No pneumothorax. Mild left base atelectasis. IMPRESSION: Appropriate position of right-sided Port-A-Cath, without pneumothorax. Left larger than right pleural effusions are similar and small. Adjacent left base atelectasis. Electronically Signed   By: Abigail Miyamoto M.D.   On: 01/17/2017 12:21   Dg C-arm 1-60 Min-no Report  Result Date: 01/17/2017 Fluoroscopy was utilized by the requesting physician.  No radiographic interpretation.    Anti-infectives: Anti-infectives (From admission, onward)   Start     Dose/Rate Route Frequency Ordered Stop   01/17/17 0908  ceFAZolin (ANCEF) 2-4 GM/100ML-% IVPB    Comments:  Harvell, Gwendolyn  : cabinet override      01/17/17 0908 01/17/17 2114   01/17/17 0900  ceFAZolin (ANCEF) IVPB 2g/100 mL premix     2 g 200 mL/hr over 30 Minutes Intravenous  Once 01/17/17 0802     01/17/17  0800  ceFAZolin (ANCEF) powder 2 g  Status:  Discontinued     2 g Other To Surgery 01/17/17 0746 01/17/17 0802   01/14/17 1046  cefoTEtan in Dextrose 5% (CEFOTAN) 2-2.08 GM-%(50ML) IVPB    Comments:  Bridget Hartshorn   : cabinet override      01/14/17 1046 01/14/17 2259   01/14/17 0600  cefoTEtan (CEFOTAN) 2 g in dextrose 5 % 50 mL IVPB  Status:  Discontinued     2 g 100 mL/hr over 30 Minutes Intravenous On call to O.R. 01/13/17 1427 01/13/17 1437   01/14/17 0600  cefoTEtan (CEFOTAN) 2 g in dextrose 5 % 50 mL IVPB  Status:  Discontinued     2 g 100 mL/hr over 30  Minutes Intravenous On call to O.R. 01/13/17 1438 01/13/17 1438   01/14/17 0600  cefoTEtan (CEFOTAN) 2 g in dextrose 5 % 50 mL IVPB     2 g 100 mL/hr over 30 Minutes Intravenous On call to O.R. 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 Marcello Moores - POD 4 - Port placed 11/19 - tolerating regular diet - air but no stool from colostomy  ID - none currently FEN - regular diet VTE - SCDs, lovenox Foley - none Follow up - Dr. Marcello Moores  Plan - Continue regular diet. Patient declines colace at this time. Continue ambulating and drinking fluids. Ok for discharge once he has output from colostomy. Can remove honeycomb dressing tomorrow. He will follow up with Dr. Marcello Moores in about 2 weeks.   LOS: 6 days    Wellington Hampshire , [facility] Surgery 01/18/2017, 8:04 AM Pager: [phone] Consults: [phone] Mon-Fri 7:00 am-4:30 pm Sat-Sun 7:00 am-11:30 am

## 2017-01-24 ENCOUNTER — Ambulatory Visit: Payer: Self-pay | Admitting: General Surgery

## 2017-01-26 ENCOUNTER — Telehealth: Payer: Self-pay | Admitting: Hematology

## 2017-01-26 NOTE — Telephone Encounter (Signed)
Scheduled appt per 11/27 sch msg - moved to 11/28 per Dr. Burr Medico. Spoke with patient regarding appts that were added.

## 2017-01-26 NOTE — Progress Notes (Signed)
[place]  [phone] Fax:(336) [phone]  Clinic Follow Up Note   Patient Care Team: Timmothy Euler, MD as PCP - General (Family Medicine) Kathie Rhodes, MD as Consulting Physician (Urology) Truitt Merle, MD as Consulting Physician (Hematology) Leighton Ruff, MD as Consulting Physician (General Surgery) 01/27/2017  CHIEF COMPLAINTS:  Metastatic left colon Cancer  Oncology History   Cancer Staging Cancer of left colon [facility]) Staging form: Colon and Rectum, AJCC 8th Edition - Pathologic stage from 01/14/2017: Stage IVC (pT4a, pN1a, pM1c) - Signed by Truitt Merle, MD on 01/30/2017       Cancer of left colon ([place])   01/11/2017 Imaging    CT AP W Contrast 01/11/17  IMPRESSION: 1. Irregular annular 6.2 x 3.6 cm mass in the sigmoid colon suspicious for primary colonic malignancy. 2. Multiple mildly enlarged lymph nodes in the sigmoid mesenteries suspicious for locoregional nodal metastases. 3. Extensive soft tissue caking throughout the omentum. Smooth peritoneal thickening and enhancement throughout the peritoneal cavity. Moderate volume ascites. 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 4. Small dependent left pleural effusion. 5.  Aortic Atherosclerosis (ICD10-I70.0).  Coronary atherosclerosis.      01/13/2017 Procedure     Flexible sigmoidoscopy Dr. Loletha Carrow 01/13/2017  Findings: A fungating, infiltrative and ulcerated completely obstructing mass was found in the recto-sigmoid colon (about 20 cm from anal verge). The mass was circumferential. Mucosa was biopsied with a cold forceps for histology. One specimen bottle was sent to pathology. The exam was otherwise without abnormality. - Likely malignant completely obstructing tumor in the recto-sigmoid colon. Biopsied. - The examination was otherwise normal.        01/13/2017 Initial Biopsy    Diagnosis 01/13/17  Colon, biopsy, sigmoid mass - POORLY DIFFERENTIATED  ADENOCARCINOMA WITH SIGNET RING CELLS. Microscopic Comment Dr. Saralyn Pilar has reviewed the case. Dr. Loletha Carrow was paged on 01/14/2017.      01/14/2017 Surgery    LAPAROSCOPIC SIGMOIDECTOMY , OMENTECTOMY BIOPSY WITH END COLOSTOMY by Dr. Marcello Moores on 01/14/17      01/14/2017 Pathology Results    Diagnosis 01/14/17  1. Omentum, resection for tumor - METASTATIC ADENOCARCINOMA. 2. Colon, segmental resection for tumor, sigmoid - INVASIVE ADENOCARCINOMA WITH EXTRACELLULAR MUCIN AND SIGNET RING CELLS, POORLY DIFFERENTIATED, SPANNING 4 CM. - TUMOR INVOLVES SEROSA. - RESECTION MARGINS ARE NEGATIVE. - METASTATIC CARCINOMA IN ONE OF FIVE LYMPH NODES (1/5). - SATELLITE NODULES. - SEE ONCOLOGY TABLE.      01/15/2017 Imaging    CT Chest W Contrast  IMPRESSION: 1. No evidence of metastatic disease in the chest. 2. Small left pleural effusion and trace right pleural effusion without obvious pleural metastatic disease. 3. Coronary atherosclerosis in a 3 vessel distribution. 4. Free intraperitoneal air in the upper abdomen related to recent abdominal surgery.      01/18/2017 Initial Diagnosis    Cancer of left colon [facility])       Chemotherapy    PENDING FOLFIRI every 2 weeks to start on 02/03/17         HISTORY OF PRESENTING ILLNESS: 01/13/17 Thomas Wagner is a 76 year old Caucasian male, without significant past medical history except hypertension, presented with worsening abdominal bloating and pain, nausea, and constipation for the past 2-3 weeks, no significant weight loss.  Patient states his symptoms started about 6 weeks ago, worse in the past few weeks.  His abdominal pain is mainly located in the left lower quadrant, constant, tolerable.   He was admitted on January 11, 2017.  CT of abdomen and  pelvis with contrast which showed a irregular large mass in the sigmoid colon, multiple enlarged lymph nodes in the sigmoid mesentery, extensive soft tissue caking throughout of the omentum and  moderate ascites, suspicious for peritoneal metastasis.  He underwent flexible sigmoidoscopy by Dr. Loletha Carrow today, which showed a complete obstructed sigmoid colon mass, biopsy was taken.  Results are pending.  He was seen by corrective surgeon Dr. Marcello Moores today, she plan to take pt to OR for laparoscopic Hartman's procedure tomorrow.   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 Loel Lofty. Per pt, Dr. Karsten Ro plans to refer him to [facility] for complete surgical resection.   CURRENT THERAPY:  PENDING FOLFIRI every 2 weeks to start on 02/03/17    INTERVAL HISTORY  Thomas Wagner 76 y.o. male is here for a follow up post sigmoidecotmy on 01/14/17. He presents to the clinic today accompanied by his daughter and wife.  He notes his surgery went well. He has soreness from incision. He notes he had a large knot on his right back this morning that seems resolved now. He is still getting use to his colostomy bag. He notes seeing bumps or knots around incision that comes and goes. His appetite is not good, he tries to make himself. He stops eatig before any episodes of vomiting. He has lost weight since left the hospital. He has been taking some ensure boost but is willing to increase. He denies nausea and his stool is good. He denies having neuropathy. He and his family asked about high dose Vitamin C and Hemp oil use. His PCP is Dr. Kenn File. He opted in for a flu shot today.    REVIEW OF SYSTEMS:   Constitutional: Denies fevers, chills or abnormal night sweats (+) low appetite (+) weight loss  Eyes: Denies blurriness of vision, double vision or watery eyes Ears, nose, mouth, throat, and face: Denies mucositis or sore throat Respiratory: Denies cough, dyspnea or wheezes Cardiovascular: Denies palpitation, chest discomfort or lower extremity swelling Gastrointestinal:  Denies nausea, heartburn or change in bowel habits (+) colostomy  bag (+) soreness from incision  Skin: Denies abnormal skin rashes Lymphatics: Denies new lymphadenopathy or easy bruising Neurological:Denies numbness, tingling or new weaknesses Behavioral/Psych: Mood is stable, no new changes  All other systems were reviewed with the patient and are negative.   MEDICAL HISTORY:  Past Medical History:  Diagnosis Date  . History of traumatic subdural hematoma    08-05-2005  --- pt fell off roof--- per imaging right posteroparietal-right parafalcine subdural hematoma w/ concussion --- per pt no residual  . Hypertension   . Nocturia   . Penile mass   . Phimosis   . Pre-diabetes   . Wears dentures    upper    SURGICAL HISTORY: Past Surgical History:  Procedure Laterality Date  . CIRCUMCISION N/A 01/03/2017   Procedure: CIRCUMCISION ADULT/ POSSIBLE PENILE BIOPSY;  Surgeon: Kathie Rhodes, MD;  Location: [facility];  Service: Urology;  Laterality: N/A;  . COLON RESECTION N/A 01/14/2017   Procedure: LAPAROSCOPIC SIGMOIDECTOMY , OMENTECTOMY BIOPSY WITH END COLOSTOMY;  Surgeon: Leighton Ruff, MD;  Location: [facility];  Service: General;  Laterality: N/A;  . FLEXIBLE SIGMOIDOSCOPY N/A 01/13/2017   Procedure: FLEXIBLE SIGMOIDOSCOPY;  Surgeon: Doran Stabler, MD;  Location: [facility];  Service: Gastroenterology;  Laterality: N/A;  . NO PAST SURGERIES    . PORTACATH PLACEMENT N/A 01/17/2017   Procedure: INSERTION  PORT-A-CATH;  Surgeon: Excell Seltzer, MD;  Location: [facility];  Service: General;  Laterality: N/A;    SOCIAL HISTORY: Social History   Socioeconomic History  . Marital status: Married    Spouse name: Not on file  . Number of children: Not on file  . Years of education: Not on file  . Highest education level: Not on file  Social Needs  . Financial resource strain: Not on file  . Food insecurity - worry: Not on file  . Food insecurity - inability: Not on file  . Transportation needs - medical: Not on file  .  Transportation needs - non-medical: Not on file  Occupational History  . Not on file  Tobacco Use  . Smoking status: Former Smoker    Years: 5.00    Types: Cigarettes    Last attempt to quit: 12/24/1973    Years since quitting: 43.1  . Smokeless tobacco: Former Systems developer    Types: [place] date: 12/25/1978  Substance and Sexual Activity  . Alcohol use: No  . Drug use: No  . Sexual activity: Not on file  Other Topics Concern  . Not on file  Social History Narrative  . Not on file    FAMILY HISTORY: Family History  Problem Relation Age of Onset  . Pancreatic cancer Mother   . Other Daughter        Precancerous lesions on colonoscopy  . Other Son        Precancerous lesion on colonoscopy    ALLERGIES:  has No Known Allergies.  MEDICATIONS:  Current Outpatient Medications  Medication Sig Dispense Refill  . amLODipine (NORVASC) 5 MG tablet Take 2.5 mg by mouth every evening.    . benazepril (LOTENSIN) 20 MG tablet Take 10 mg by mouth every evening.    Marland Kitchen ibuprofen (ADVIL,MOTRIN) 200 MG tablet Take 400 mg 2 (two) times daily as needed by mouth.    . lidocaine-prilocaine (EMLA) cream Apply 1 application topically as needed. 30 g 0  . megestrol (MEGACE ES) 625 MG/5ML suspension Take 5 mLs (625 mg total) by mouth daily. 150 mL 0  . oxycodone (OXY-IR) 5 MG capsule Take 1-2 capsules (5-10 mg total) every 4 (four) hours as needed by mouth. 10 capsule 0   No current facility-administered medications for this visit.     PHYSICAL EXAMINATION: ECOG PERFORMANCE STATUS: 2 - Symptomatic, <50% confined to bed  Vitals:   01/27/17 1502  BP: (!) 143/85  Pulse: 85  Resp: 18  SpO2: 96%   Filed Weights   01/27/17 1502  Weight: 183 lb 12.8 oz (83.4 kg)    GENERAL:alert, no distress and comfortable SKIN: skin color, texture, turgor are normal, no rashes or significant lesions EYES: normal, conjunctiva are pink and non-injected, sclera clear OROPHARYNX:no exudate, no erythema and  lips, buccal mucosa, and tongue normal  NECK: supple, thyroid normal size, non-tender, without nodularity LYMPH:  no palpable lymphadenopathy in the cervical, axillary or inguinal LUNGS: clear to auscultation and percussion with normal breathing effort HEART: regular rate & rhythm and no murmurs and no lower extremity edema ABDOMEN:abdomen soft, non-tender and normal bowel sounds    (+) Colostomy bag on left abdomen with yellown loose stool  Musculoskeletal:no cyanosis of digits and no clubbing  PSYCH: alert & oriented x 3 with fluent speech NEURO: no focal motor/sensory deficits  LABORATORY DATA:  I have reviewed the data as listed CBC Latest Ref Rng & Units 01/27/2017 01/18/2017 01/17/2017  WBC 4.0 -  10.3 10e3/uL 10.8(H) 7.5 11.1(H)  Hemoglobin 13.0 - 17.1 g/dL 12.5(L) 11.4(L) 11.5(L)  Hematocrit 38.4 - 49.9 % 37.4(L) 34.1(L) 34.1(L)  Platelets 140 - 400 10e3/uL 464(H) 330 320    CMP Latest Ref Rng & Units 01/27/2017 01/15/2017 01/12/2017  Glucose 70 - 140 mg/dl 135 153(H) 119(H)  BUN 7.0 - 26.0 mg/dL 12.2 6 10   Creatinine 0.7 - 1.3 mg/dL 1.0 0.94 0.87  Sodium 136 - 145 mEq/L 134(L) 136 135  Potassium 3.5 - 5.1 mEq/L 4.2 4.0 3.9  Chloride 101 - 111 mmol/L - 104 105  CO2 22 - 29 mEq/L 26 27 23   Calcium 8.4 - 10.4 mg/dL 9.1 8.1(L) 8.5(L)  Total Protein 6.4 - 8.3 g/dL 7.1 - 6.2(L)  Total Bilirubin 0.20 - 1.20 mg/dL 0.34 - 0.6  Alkaline Phos 40 - 150 U/L 78 - 67  AST 5 - 34 U/L 14 - 20  ALT 0 - 55 U/L 12 - 15(L)    Baseline CEA 3.2 on 01/14/17   PATHOLOGY   Diagnosis 01/14/17  1. Omentum, resection for tumor - METASTATIC ADENOCARCINOMA. 2. Colon, segmental resection for tumor, sigmoid - INVASIVE ADENOCARCINOMA WITH EXTRACELLULAR MUCIN AND SIGNET RING CELLS, POORLY DIFFERENTIATED, SPANNING 4 CM. - TUMOR INVOLVES SEROSA. - RESECTION MARGINS ARE NEGATIVE. - METASTATIC CARCINOMA IN ONE OF FIVE LYMPH NODES (1/5). - SATELLITE NODULES. - SEE ONCOLOGY TABLE. Microscopic  Comment 2. COLON AND RECTUM (INCLUDING TRANS-ANAL RESECTION): Specimen: Sigmoid colon. Procedure: Segmental resection. Tumor site: Sigmoid. Specimen integrity: Intact. Macroscopic tumor perforation: Not identified. Invasive tumor: Maximum size: 4 cm. Histologic type(s): Invasive adenocarcinoma with extracellular mucin and signet ring cells. Histologic grade and differentiation: G3: poorly differentiated/high grade Type of polyp in which invasive carcinoma arose: N/A. Microscopic extension of invasive tumor: Through serosa. Lymph-Vascular invasion: Present. Peri-neural invasion: Not identified. Tumor deposit(s) (discontinuous extramural extension): Present. Resection margins: Proximal margin: Negative. Distal margin: Negative. Circumferential (radial) (posterior ascending, posterior descending; lateral 1 of 3 Supplemental copy SUPPLEMENTAL for EWING, FANDINO E [phone]) Microscopic Comment(continued) and posterior mid-rectum; and entire lower 1/3 rectum): N/A. Mesenteric margin (sigmoid and transverse): Negative. Distance closest margin (if all above margins negative): 3 cm (mesenteric). Treatment effect (neo-adjuvant therapy): N/A. Additional polyp(s): None. Non-neoplastic findings: None. Lymph nodes: number examined 5; number positive: 1 Pathologic Staging: pT4a, pNa, pM1a Ancillary studies: MSI and MMR will be ordered. JULIA MANNY   Diagnosis 01/13/17  Colon, biopsy, sigmoid mass - POORLY DIFFERENTIATED ADENOCARCINOMA WITH SIGNET RING CELLS. Microscopic Comment Dr. Saralyn Pilar has reviewed the case. Dr. Loletha Carrow was paged on 01/14/2017.    Diagnosis 01/03/17  Foreskin, adult, with mass - INVASIVE SQUAMOUS CELL CARCINOMA, WELL DIFFERENTIATED, SPANNING 1.3 CM. - TUMOR INVADES 0.2 CM. - TUMOR IS PRESENT AT ONE OF THE LATERAL INKED MARGINS. - DEEP MARGINS ARE NEGATIVE. - BALANITIS XEROTIC OBLITERANS. Microscopic Comment Dr. Lyndon Code has reviewed the  case.    PROCEDURES   Flexible sigmoidoscopy Dr. Loletha Carrow 01/13/2017  Findings: A fungating, infiltrative and ulcerated completely obstructing mass was found in the recto-sigmoid colon (about 20 cm from anal verge). The mass was circumferential. Mucosa was biopsied with a cold forceps for histology. One specimen bottle was sent to pathology. The exam was otherwise without abnormality. - Likely malignant completely obstructing tumor in the recto-sigmoid colon. Biopsied. - The examination was otherwise normal.     RADIOGRAPHIC STUDIES: I have personally reviewed the radiological images as listed and agreed with the findings in the report. Dg Chest 2 View  Result Date: 01/17/2017 CLINICAL DATA:  Postop for Port-A-Cath placement. EXAM: CHEST  2 VIEW COMPARISON:  CT 01/15/2017. Most recent chest radiograph 08/05/2005. FINDINGS: Right-sided Port-A-Cath terminates at the mid SVC. Midline trachea. Left larger than right pleural effusions, as before. No pneumothorax. Mild left base atelectasis. IMPRESSION: Appropriate position of right-sided Port-A-Cath, without pneumothorax. Left larger than right pleural effusions are similar and small. Adjacent left base atelectasis. Electronically Signed   By: Abigail Miyamoto M.D.   On: 01/17/2017 12:21   Ct Chest W Contrast  Result Date: 01/15/2017 CLINICAL DATA:  Status post resection of obstructing sigmoid colonic adenocarcinoma. EXAM: CT CHEST WITH CONTRAST TECHNIQUE: Multidetector CT imaging of the chest was performed during intravenous contrast administration. CONTRAST:  75 mL ISOVUE-300 IV COMPARISON:  CT of the abdomen and pelvis on 01/11/2017 FINDINGS: Cardiovascular: The heart size is normal. The thoracic aorta shows no evidence of aneurysmal disease. Calcified coronary artery plaque identified in a 3 vessel distribution. No pericardial fluid. Central pulmonary arteries are normal in caliber. Mediastinum/Nodes: Tiny mediastinal lymph nodes are present. No  enlarged mediastinal, axillary or hilar lymphadenopathy identified. Lungs/Pleura: No pulmonary nodules or airspace consolidation. No pulmonary edema. Small left pleural effusion and trace right pleural effusion. No obvious pleural nodularity. Upper Abdomen: Small amount of free intraperitoneal air identified in the upper abdomen related to recent abdominal surgery. Musculoskeletal: No bony lesions or fractures. IMPRESSION: 1. No evidence of metastatic disease in the chest. 2. Small left pleural effusion and trace right pleural effusion without obvious pleural metastatic disease. 3. Coronary atherosclerosis in a 3 vessel distribution. 4. Free intraperitoneal air in the upper abdomen related to recent abdominal surgery. Electronically Signed   By: Aletta Edouard M.D.   On: 01/15/2017 14:58   Ct Abdomen Pelvis W Contrast  Result Date: 01/11/2017 CLINICAL DATA:  Abdominal pain, cramping, bloating and diarrhea for 1 month. EXAM: CT ABDOMEN AND PELVIS WITH CONTRAST TECHNIQUE: Multidetector CT imaging of the abdomen and pelvis was performed using the standard protocol following bolus administration of intravenous contrast. CONTRAST:  140m ISOVUE-300 IOPAMIDOL (ISOVUE-300) INJECTION 61% COMPARISON:  None. FINDINGS: Lower chest: Small dependent left pleural effusion. Mild dependent left lower lobe atelectasis. Coronary atherosclerosis. Hepatobiliary: Normal liver with no liver mass. Normal gallbladder with no radiopaque cholelithiasis. No biliary ductal dilatation. Pancreas: Normal, with no mass or duct dilation. Spleen: Normal size. No mass. Adrenals/Urinary Tract: No discrete adrenal nodules. No hydronephrosis. Small simple parapelvic renal cysts in both kidneys. No suspicious renal masses. Normal bladder. Stomach/Bowel: Small hiatal hernia. Otherwise normal nondistended stomach. Normal caliber small bowel with no small bowel wall thickening. Normal appendix. There is an irregular annular mass in the sigmoid colon  measuring approximately 6.2 x 3.6 cm (series 2/ image 79). There is moderate left colonic diverticulosis. No significant large bowel dilatation. No additional sites of large bowel wall thickening. Vascular/Lymphatic: Atherosclerotic nonaneurysmal abdominal aorta. Patent portal, splenic, hepatic and renal veins. There are several clustered mildly enlarged lymph nodes in the sigmoid mesentery measuring up to 1.4 cm (series 2/ image 75). Reproductive: Top-normal size prostate. Other: There is extensive soft tissue caking throughout the omentum. Moderate volume ascites. Smooth peritoneal thickening and enhancement throughout the peritoneal cavity. Musculoskeletal: No aggressive appearing focal osseous lesions. Marked thoracolumbar spondylosis. IMPRESSION: 1. Irregular annular 6.2 x 3.6 cm mass in the sigmoid colon suspicious for primary colonic malignancy. 2. Multiple mildly enlarged lymph nodes in the sigmoid mesenteries suspicious for locoregional nodal metastases. 3. Extensive soft tissue caking throughout the omentum. Smooth peritoneal thickening and enhancement throughout the peritoneal  cavity. Moderate volume ascites. 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 4. Small dependent left pleural effusion. 5.  Aortic Atherosclerosis (ICD10-I70.0).  Coronary atherosclerosis. Electronically Signed   By: Ilona Sorrel M.D.   On: 01/11/2017 16:31   Dg C-arm 1-60 Min-no Report  Result Date: 01/17/2017 Fluoroscopy was utilized by the requesting physician.  No radiographic interpretation.     ASSESSMENT & PLAN:  PAVLOS YON is a 76 y.o. caucasian male without significant past medical history, except hypertension.   1. Left colon Cancer, pT4aN1aM1c, with peritoneum metastatic, MSI-stable  -I again reviewed his CT AP scan, and endoscopy findings,  and surgical pathology findings which showed metastatic sigmoid colon cancer to peritoneum. -CT Chest showed no evidence of mets in the chest.   -We have discussed his case in our GI tumor board.  During the diffuse large burden peritoneal metastasis, he is at risk for his bowel obstruction, will monitor closely. -We discussed the incurable nature of his disease.  Given his advanced age, large disease burden in the peritoneum, he is unlikely a candidate for [facility], which may potentially cure peritoneum carcinomatosis from colon cancer. -We reviewed the natural history of metastatic colon cancer and overall better outcome of her left side: The right side colon cancer.  We discussed treatment options.  I recommend him to consider systemic chemotherapy, we reviewed chemo regimens, such as CAPOX, FOLFOX or FOLFIRI.  Given his left side colon cancer, I recommend him to start first-lineFOLFIRI.  --Chemotherapy consent: Side effects including but does not not limited to, fatigue, nausea, vomiting, diarrhea, hair loss, neuropathy, fluid retention, renal and kidney dysfunction, neutropenic fever, needed for blood transfusion, bleeding, were discussed with patient in great detail.  He agreed to proceed. He plans to start FOLFIRI every 2 weeks on 02/03/17.  Given his advanced age, I will reduce his chemo dose for first cycle, to see if he can tolerate it well. -I have requested a [employer] test to see if he is eligible for Avastin or EGFR inhibitor such as Panitumumab, in addition to to his chemotherapy. Results are pending.  -Given his metastatic disease, the goal of therapy would be palliative, to control his disease, prolong his life.  -He completed Chemo class today and has had his port placed.  -Will do repeat CT scans every 2-3 month. We will monitor his tumor marker CEA every 2-3 months as well.  -Labs reviewed, Hg is 12.5, his iron study today is pending. I suggest ferrous sulfate OTC if his levels are low.  -F/u in 3 weeks with cycle 2  -I offered the flu shot today, he opted in for the vaccination.   2. Low appetite, Weight loss -Secondary  to cancer  -I encouraged him to increase ensure boost intake up to 3-4 bottles a day  -I suggest Megace to help stimulate his appetite. He is fine to use hemp oil if needed.  -Will set up dietitian consult for patient to be seen during first infusion.    3. 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 He had positive margins after resection.  -He is being followed by Dr. Loel Lofty, urologist.  -I will consult with Dr. Loel Lofty on possible further intervention.   4. Anemia -His recent labs shows mild anemia, likely related to his cancer.  -Today's iron study is pending. If results are low I suggest he start on oral iron Ferrous sulfate.   5. Goal of  care discussion  -We again discussed the incurable nature of his cancer, and the overall poor prognosis, especially if he does not have good response to chemotherapy or progress on chemo -The patient understands the goal of care is palliative. -I recommend DNR/DNI, he will think about it   PLAN:  -Flu shot today, high dose  -Prescribe EMLA cream Zofran and Compazine and Megace today  -Lab, flush, and chemo FOLFIRI in one and 3 weeks  -F/u in 3 weeks  -Nutrition consult     Orders Placed This Encounter  Procedures  . CBC with Differential    Standing Status:   Standing    Number of Occurrences:   100    Standing Expiration Date:   01/27/2022  . Comprehensive metabolic panel    Standing Status:   Standing    Number of Occurrences:   100    Standing Expiration Date:   01/27/2022  . Ferritin    Standing Status:   Standing    Number of Occurrences:   100    Standing Expiration Date:   01/27/2022  . Iron and TIBC    Standing Status:   Standing    Number of Occurrences:   100    Standing Expiration Date:   01/27/2022  . CEA    Standing Status:   Standing    Number of Occurrences:   100    Standing Expiration Date:   01/27/2022    All questions were answered. The patient knows  to call the clinic with any problems, questions or concerns. I spent 30 minutes counseling the patient face to face. The total time spent in the appointment was 40 minutes and more than 50% was on counseling.     Truitt Merle, MD 01/27/2017    This document serves as a record of services personally performed by Truitt Merle, MD. It was created on her behalf by Joslyn Devon, a trained medical scribe. The creation of this record is based on the scribe's personal observations and the provider's statements to them.    I have reviewed the above documentation for accuracy and completeness, and I agree with the above.

## 2017-01-26 NOTE — Progress Notes (Signed)
  Oncology Nurse Navigator Documentation  Navigator Location: [facility] (01/26/17 1004)   )Navigator Encounter Type: Other([facility] request) (01/26/17 1004)                         Barriers/Navigation Needs: Coordination of Care (01/26/17 1004)   Interventions: Other([facility] Testing Request/Pathology) (01/26/17 1004)   Coordination of Care: Other(Molecular Testing) (01/26/17 1004)   Patient was discussed at GI Conference this [place] [facility] testing request e-mailed to [facility] Pathology per Dr. Ernestina Penna request.     Acuity: Level 1 (01/26/17 1004)         Time Spent with Patient: 15 (01/26/17 1004)

## 2017-01-27 ENCOUNTER — Other Ambulatory Visit (HOSPITAL_BASED_OUTPATIENT_CLINIC_OR_DEPARTMENT_OTHER): Payer: Medicare Other

## 2017-01-27 ENCOUNTER — Ambulatory Visit: Payer: Medicare Other | Admitting: Oncology

## 2017-01-27 ENCOUNTER — Other Ambulatory Visit: Payer: Medicare Other

## 2017-01-27 ENCOUNTER — Encounter: Payer: Self-pay | Admitting: *Deleted

## 2017-01-27 ENCOUNTER — Telehealth: Payer: Self-pay | Admitting: Hematology

## 2017-01-27 ENCOUNTER — Ambulatory Visit (HOSPITAL_BASED_OUTPATIENT_CLINIC_OR_DEPARTMENT_OTHER): Payer: Medicare Other | Admitting: Hematology

## 2017-01-27 VITALS — BP 143/85 | HR 85 | Resp 18 | Ht 72.0 in | Wt 183.8 lb

## 2017-01-27 DIAGNOSIS — Z23 Encounter for immunization: Secondary | ICD-10-CM | POA: Diagnosis not present

## 2017-01-27 DIAGNOSIS — D649 Anemia, unspecified: Secondary | ICD-10-CM

## 2017-01-27 DIAGNOSIS — C600 Malignant neoplasm of prepuce: Secondary | ICD-10-CM

## 2017-01-27 DIAGNOSIS — C187 Malignant neoplasm of sigmoid colon: Secondary | ICD-10-CM

## 2017-01-27 DIAGNOSIS — R630 Anorexia: Secondary | ICD-10-CM

## 2017-01-27 DIAGNOSIS — R634 Abnormal weight loss: Secondary | ICD-10-CM | POA: Diagnosis not present

## 2017-01-27 DIAGNOSIS — C186 Malignant neoplasm of descending colon: Secondary | ICD-10-CM

## 2017-01-27 DIAGNOSIS — D500 Iron deficiency anemia secondary to blood loss (chronic): Secondary | ICD-10-CM

## 2017-01-27 DIAGNOSIS — Z7189 Other specified counseling: Secondary | ICD-10-CM | POA: Insufficient documentation

## 2017-01-27 DIAGNOSIS — C786 Secondary malignant neoplasm of retroperitoneum and peritoneum: Secondary | ICD-10-CM | POA: Diagnosis not present

## 2017-01-27 LAB — COMPREHENSIVE METABOLIC PANEL
ALBUMIN: 2.9 g/dL — AB (ref 3.5–5.0)
ALK PHOS: 78 U/L (ref 40–150)
ALT: 12 U/L (ref 0–55)
ANION GAP: 10 meq/L (ref 3–11)
AST: 14 U/L (ref 5–34)
BILIRUBIN TOTAL: 0.34 mg/dL (ref 0.20–1.20)
BUN: 12.2 mg/dL (ref 7.0–26.0)
CO2: 26 meq/L (ref 22–29)
Calcium: 9.1 mg/dL (ref 8.4–10.4)
Chloride: 99 mEq/L (ref 98–109)
Creatinine: 1 mg/dL (ref 0.7–1.3)
GLUCOSE: 135 mg/dL (ref 70–140)
POTASSIUM: 4.2 meq/L (ref 3.5–5.1)
SODIUM: 134 meq/L — AB (ref 136–145)
TOTAL PROTEIN: 7.1 g/dL (ref 6.4–8.3)

## 2017-01-27 LAB — FERRITIN: FERRITIN: 433 ng/mL — AB (ref 22–316)

## 2017-01-27 LAB — CBC WITH DIFFERENTIAL/PLATELET
BASO%: 0.3 % (ref 0.0–2.0)
Basophils Absolute: 0 10*3/uL (ref 0.0–0.1)
EOS%: 0.4 % (ref 0.0–7.0)
Eosinophils Absolute: 0 10*3/uL (ref 0.0–0.5)
HEMATOCRIT: 37.4 % — AB (ref 38.4–49.9)
HEMOGLOBIN: 12.5 g/dL — AB (ref 13.0–17.1)
LYMPH#: 0.7 10*3/uL — AB (ref 0.9–3.3)
LYMPH%: 6.3 % — ABNORMAL LOW (ref 14.0–49.0)
MCH: 28.1 pg (ref 27.2–33.4)
MCHC: 33.4 g/dL (ref 32.0–36.0)
MCV: 84 fL (ref 79.3–98.0)
MONO#: 1.3 10*3/uL — AB (ref 0.1–0.9)
MONO%: 12 % (ref 0.0–14.0)
NEUT#: 8.7 10*3/uL — ABNORMAL HIGH (ref 1.5–6.5)
NEUT%: 81 % — ABNORMAL HIGH (ref 39.0–75.0)
NRBC: 0 % (ref 0–0)
Platelets: 464 10*3/uL — ABNORMAL HIGH (ref 140–400)
RBC: 4.45 10*6/uL (ref 4.20–5.82)
RDW: 13.8 % (ref 11.0–14.6)
WBC: 10.8 10*3/uL — ABNORMAL HIGH (ref 4.0–10.3)

## 2017-01-27 LAB — IRON AND TIBC
%SAT: 7 % — AB (ref 20–55)
IRON: 17 ug/dL — AB (ref 42–163)
TIBC: 235 ug/dL (ref 202–409)
UIBC: 219 ug/dL (ref 117–376)

## 2017-01-27 LAB — CEA (IN HOUSE-CHCC): CEA (CHCC-IN HOUSE): 4.22 ng/mL (ref 0.00–5.00)

## 2017-01-27 MED ORDER — INFLUENZA VAC SPLIT HIGH-DOSE 0.5 ML IM SUSY
0.5000 mL | PREFILLED_SYRINGE | INTRAMUSCULAR | Status: AC
  Administered 2017-01-27: 0.5 mL via INTRAMUSCULAR
  Filled 2017-01-27: qty 0.5

## 2017-01-27 MED ORDER — MEGESTROL ACETATE 625 MG/5ML PO SUSP: 625.0000 mg | Freq: Every day | ORAL | 0 refills | Status: DC

## 2017-01-27 MED ORDER — LIDOCAINE-PRILOCAINE 2.5-2.5 % EX CREA: 1.0000 "application " | TOPICAL_CREAM | CUTANEOUS | 0 refills | Status: AC | PRN

## 2017-01-27 NOTE — Progress Notes (Signed)
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

## 2017-01-27 NOTE — Telephone Encounter (Signed)
Unable to schedule 11/29 due to capped day next week - sent e-mail to [facility] for approval - patient is aware and will wait for call .

## 2017-01-28 ENCOUNTER — Other Ambulatory Visit: Payer: Self-pay | Admitting: *Deleted

## 2017-01-28 ENCOUNTER — Telehealth: Payer: Self-pay

## 2017-01-28 ENCOUNTER — Telehealth: Payer: Self-pay | Admitting: Hematology

## 2017-01-28 MED ORDER — MEGESTROL ACETATE 40 MG/ML PO SUSP: 640.0000 mg | Freq: Every day | ORAL | 1 refills | Status: AC

## 2017-01-28 NOTE — Telephone Encounter (Signed)
Scheduled appt per 11/29 los - patients wife is aware of appt dates and time. - gave verbal understanding

## 2017-01-28 NOTE — Telephone Encounter (Signed)
Pharmacist called that the extra strength megace will cost the pt more than $400 per bottle. She is asking for new rx for megace 40mg /ml with a dispense of 480 mls. This will cost the pt about $40-50  [employer].

## 2017-01-28 NOTE — Telephone Encounter (Signed)
OK to fill the 40mg /ml for 2 weeks.  Truitt Merle MD

## 2017-01-28 NOTE — Telephone Encounter (Signed)
error 

## 2017-01-30 ENCOUNTER — Encounter: Payer: Self-pay | Admitting: Hematology

## 2017-01-30 DIAGNOSIS — D500 Iron deficiency anemia secondary to blood loss (chronic): Secondary | ICD-10-CM | POA: Insufficient documentation

## 2017-01-31 NOTE — Telephone Encounter (Signed)
Noted rx sent on 11/30

## 2017-02-01 ENCOUNTER — Telehealth: Payer: Self-pay

## 2017-02-01 ENCOUNTER — Other Ambulatory Visit: Payer: Self-pay | Admitting: Nurse Practitioner

## 2017-02-01 DIAGNOSIS — C186 Malignant neoplasm of descending colon: Secondary | ICD-10-CM

## 2017-02-01 DIAGNOSIS — Z7189 Other specified counseling: Secondary | ICD-10-CM

## 2017-02-01 MED ORDER — ONDANSETRON HCL 8 MG PO TABS: 8.0000 mg | ORAL_TABLET | Freq: Two times a day (BID) | ORAL | 1 refills | Status: DC | PRN

## 2017-02-01 MED ORDER — PROCHLORPERAZINE MALEATE 10 MG PO TABS: 10.0000 mg | ORAL_TABLET | Freq: Four times a day (QID) | ORAL | 1 refills | Status: DC | PRN

## 2017-02-01 MED ORDER — LOPERAMIDE HCL 2 MG PO TABS: ORAL_TABLET | ORAL | 1 refills | Status: DC

## 2017-02-01 NOTE — Telephone Encounter (Signed)
Wife called that pt has no nausea med ordered. He starts chemo tomorrow. Explained Dr Burr Medico is out of office but may order today or they may need to ask again when they are in infusion room.

## 2017-02-01 NOTE — Telephone Encounter (Signed)
Cira Rue NP released antinausea meds from orders & these should go to pt's pharmacy.  Notified pt's wife of this.

## 2017-02-02 ENCOUNTER — Other Ambulatory Visit (HOSPITAL_BASED_OUTPATIENT_CLINIC_OR_DEPARTMENT_OTHER): Payer: Medicare Other

## 2017-02-02 ENCOUNTER — Other Ambulatory Visit: Payer: Self-pay | Admitting: Hematology

## 2017-02-02 ENCOUNTER — Telehealth: Payer: Self-pay | Admitting: *Deleted

## 2017-02-02 ENCOUNTER — Ambulatory Visit (HOSPITAL_BASED_OUTPATIENT_CLINIC_OR_DEPARTMENT_OTHER): Payer: Medicare Other

## 2017-02-02 VITALS — BP 140/82 | HR 86 | Temp 98.9°F | Resp 16

## 2017-02-02 DIAGNOSIS — C186 Malignant neoplasm of descending colon: Secondary | ICD-10-CM

## 2017-02-02 DIAGNOSIS — C4492 Squamous cell carcinoma of skin, unspecified: Secondary | ICD-10-CM

## 2017-02-02 DIAGNOSIS — C187 Malignant neoplasm of sigmoid colon: Secondary | ICD-10-CM | POA: Diagnosis not present

## 2017-02-02 DIAGNOSIS — Z5111 Encounter for antineoplastic chemotherapy: Secondary | ICD-10-CM | POA: Diagnosis present

## 2017-02-02 DIAGNOSIS — C786 Secondary malignant neoplasm of retroperitoneum and peritoneum: Secondary | ICD-10-CM

## 2017-02-02 DIAGNOSIS — Z7189 Other specified counseling: Secondary | ICD-10-CM

## 2017-02-02 LAB — CBC WITH DIFFERENTIAL/PLATELET
BASO%: 0.3 % (ref 0.0–2.0)
Basophils Absolute: 0 10*3/uL (ref 0.0–0.1)
EOS%: 0.5 % (ref 0.0–7.0)
Eosinophils Absolute: 0.1 10*3/uL (ref 0.0–0.5)
HEMATOCRIT: 35.6 % — AB (ref 38.4–49.9)
HGB: 11.8 g/dL — ABNORMAL LOW (ref 13.0–17.1)
LYMPH%: 10.3 % — ABNORMAL LOW (ref 14.0–49.0)
MCH: 27.3 pg (ref 27.2–33.4)
MCHC: 33.1 g/dL (ref 32.0–36.0)
MCV: 82.4 fL (ref 79.3–98.0)
MONO#: 1.1 10*3/uL — AB (ref 0.1–0.9)
MONO%: 11.6 % (ref 0.0–14.0)
NEUT%: 77.3 % — ABNORMAL HIGH (ref 39.0–75.0)
NEUTROS ABS: 7.5 10*3/uL — AB (ref 1.5–6.5)
Platelets: 532 10*3/uL — ABNORMAL HIGH (ref 140–400)
RBC: 4.32 10*6/uL (ref 4.20–5.82)
RDW: 14.1 % (ref 11.0–14.6)
WBC: 9.7 10*3/uL (ref 4.0–10.3)
lymph#: 1 10*3/uL (ref 0.9–3.3)

## 2017-02-02 LAB — COMPREHENSIVE METABOLIC PANEL
ALBUMIN: 3 g/dL — AB (ref 3.5–5.0)
ALK PHOS: 71 U/L (ref 40–150)
ALT: 11 U/L (ref 0–55)
AST: 14 U/L (ref 5–34)
Anion Gap: 10 mEq/L (ref 3–11)
BILIRUBIN TOTAL: 0.31 mg/dL (ref 0.20–1.20)
BUN: 13.8 mg/dL (ref 7.0–26.0)
CALCIUM: 9.1 mg/dL (ref 8.4–10.4)
CO2: 22 mEq/L (ref 22–29)
CREATININE: 0.9 mg/dL (ref 0.7–1.3)
Chloride: 105 mEq/L (ref 98–109)
EGFR: >60 mL/min/{1.73_m2} (ref >60)
GLUCOSE: 115 mg/dL (ref 70–140)
Potassium: 4.1 mEq/L (ref 3.5–5.1)
SODIUM: 137 meq/L (ref 136–145)
TOTAL PROTEIN: 7.2 g/dL (ref 6.4–8.3)

## 2017-02-02 MED ORDER — DEXTROSE 5 % IV SOLN
160.0000 mg/m2 | Freq: Once | INTRAVENOUS | Status: AC
  Administered 2017-02-02: 320 mg via INTRAVENOUS
  Filled 2017-02-02: qty 16

## 2017-02-02 MED ORDER — DEXAMETHASONE SODIUM PHOSPHATE 10 MG/ML IJ SOLN
INTRAMUSCULAR | Status: AC
  Filled 2017-02-02: qty 1

## 2017-02-02 MED ORDER — PALONOSETRON HCL INJECTION 0.25 MG/5ML
INTRAVENOUS | Status: AC
  Filled 2017-02-02: qty 5

## 2017-02-02 MED ORDER — DEXAMETHASONE SODIUM PHOSPHATE 10 MG/ML IJ SOLN
10.0000 mg | Freq: Once | INTRAMUSCULAR | Status: AC
  Administered 2017-02-02: 10 mg via INTRAVENOUS

## 2017-02-02 MED ORDER — ATROPINE SULFATE 1 MG/ML IJ SOLN
INTRAMUSCULAR | Status: AC
  Filled 2017-02-02: qty 1

## 2017-02-02 MED ORDER — ATROPINE SULFATE 1 MG/ML IJ SOLN: 0.5000 mg | Freq: Once | INTRAMUSCULAR | Status: DC | PRN

## 2017-02-02 MED ORDER — LEUCOVORIN CALCIUM INJECTION 350 MG
400.0000 mg/m2 | Freq: Once | INTRAVENOUS | Status: AC
  Administered 2017-02-02: 824 mg via INTRAVENOUS
  Filled 2017-02-02: qty 41.2

## 2017-02-02 MED ORDER — PALONOSETRON HCL INJECTION 0.25 MG/5ML
0.2500 mg | Freq: Once | INTRAVENOUS | Status: AC
  Administered 2017-02-02: 0.25 mg via INTRAVENOUS

## 2017-02-02 MED ORDER — SODIUM CHLORIDE 0.9 % IV SOLN
2425.0000 mg/m2 | INTRAVENOUS | Status: DC
  Administered 2017-02-02: 5000 mg via INTRAVENOUS
  Filled 2017-02-02: qty 100

## 2017-02-02 MED ORDER — SODIUM CHLORIDE 0.9 % IV SOLN
Freq: Once | INTRAVENOUS | Status: AC
  Administered 2017-02-02: 09:00:00 via INTRAVENOUS

## 2017-02-02 NOTE — Telephone Encounter (Signed)
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  Call back # is [phone].  Message to Dr Burr Medico.

## 2017-02-02 NOTE — Progress Notes (Signed)
  Oncology Nurse Navigator Documentation  Navigator Location: [facility] (02/02/17 1311)   )Navigator Encounter Type: Telephone (02/02/17 1311) Telephone: Outgoing Call (02/02/17 1311)                 Treatment Initiated Date: 02/02/17 (02/02/17 1311)   Treatment Phase: First Chemo Tx (02/02/17 1311)     Interventions: None required (02/02/17 1311)  I called and left message with wife letting her know that if she has any questions or concerns over the next two days to please call.          Acuity: Level 1 (02/02/17 1311)         Time Spent with Patient: 15 (02/02/17 1311)

## 2017-02-02 NOTE — Progress Notes (Signed)
Per Dr. Burr Medico, hold Avastin for this treatment.

## 2017-02-02 NOTE — Patient Instructions (Signed)
[facility] Discharge Instructions for Patients Receiving Chemotherapy  Today you received the following chemotherapy agents:  Irinotecan, Leucovorin, and 5FU.  To help prevent nausea and vomiting after your treatment, we encourage you to take your nausea medication as directed.   If you develop nausea and vomiting that is not controlled by your nausea medication, call the clinic.   BELOW ARE SYMPTOMS THAT SHOULD BE REPORTED IMMEDIATELY:  *FEVER GREATER THAN 100.5 F  *CHILLS WITH OR WITHOUT FEVER  NAUSEA AND VOMITING THAT IS NOT CONTROLLED WITH YOUR NAUSEA MEDICATION  *UNUSUAL SHORTNESS OF BREATH  *UNUSUAL BRUISING OR BLEEDING  TENDERNESS IN MOUTH AND THROAT WITH OR WITHOUT PRESENCE OF ULCERS  *URINARY PROBLEMS  *BOWEL PROBLEMS  UNUSUAL RASH Items with * indicate a potential emergency and should be followed up as soon as possible.  Feel free to call the clinic should you have any questions or concerns. The clinic phone number is [phone].  Please show the [place] at check-in to the Emergency Department and triage nurse.  Irinotecan injection What is this medicine? IRINOTECAN (ir in oh TEE kan ) is a chemotherapy drug. It is used to treat colon and rectal cancer. This medicine may be used for other purposes; ask your health care provider or pharmacist if you have questions. COMMON BRAND NAME(S): Camptosar What should I tell my health care provider before I take this medicine? They need to know if you have any of these conditions: -blood disorders -dehydration -diarrhea -infection (especially a virus infection such as chickenpox, cold sores, or herpes) -liver disease -low blood counts, like low white cell, platelet, or red cell counts -recent or ongoing radiation therapy -an unusual or allergic reaction to irinotecan, sorbitol, other chemotherapy, other medicines, foods, dyes, or preservatives -pregnant or trying to get  pregnant -breast-feeding How should I use this medicine? This drug is given as an infusion into a vein. It is administered in a hospital or clinic by a specially trained health care professional. Talk to your pediatrician regarding the use of this medicine in children. Special care may be needed. Overdosage: If you think you have taken too much of this medicine contact a poison control center or emergency room at once. NOTE: This medicine is only for you. Do not share this medicine with others. What if I miss a dose? It is important not to miss your dose. Call your doctor or health care professional if you are unable to keep an appointment. What may interact with this medicine? Do not take this medicine with any of the following medications: -atazanavir -certain medicines for fungal infections like itraconazole and ketoconazole -St. John's Wort This medicine may also interact with the following medications: -dexamethasone -diuretics -laxatives -medicines for seizures like carbamazepine, mephobarbital, phenobarbital, phenytoin, primidone -medicines to increase blood counts like filgrastim, pegfilgrastim, sargramostim -prochlorperazine -vaccines This list may not describe all possible interactions. Give your health care provider a list of all the medicines, herbs, non-prescription drugs, or dietary supplements you use. Also tell them if you smoke, drink alcohol, or use illegal drugs. Some items may interact with your medicine. What should I watch for while using this medicine? Your condition will be monitored carefully while you are receiving this medicine. You will need important blood work done while you are taking this medicine. This drug may make you feel generally unwell. This is not uncommon, as chemotherapy can affect healthy cells as well as cancer cells. Report any side effects. Continue your course of treatment  even though you feel ill unless your doctor tells you to stop. In some  cases, you may be given additional medicines to help with side effects. Follow all directions for their use. You may get drowsy or dizzy. Do not drive, use machinery, or do anything that needs mental alertness until you know how this medicine affects you. Do not stand or sit up quickly, especially if you are an older patient. This reduces the risk of dizzy or fainting spells. Call your doctor or health care professional for advice if you get a fever, chills or sore throat, or other symptoms of a cold or flu. Do not treat yourself. This drug decreases your body's ability to fight infections. Try to avoid being around people who are sick. This medicine may increase your risk to bruise or bleed. Call your doctor or health care professional if you notice any unusual bleeding. Be careful brushing and flossing your teeth or using a toothpick because you may get an infection or bleed more easily. If you have any dental work done, tell your dentist you are receiving this medicine. Avoid taking products that contain aspirin, acetaminophen, ibuprofen, naproxen, or ketoprofen unless instructed by your doctor. These medicines may hide a fever. Do not become pregnant while taking this medicine. Women should inform their doctor if they wish to become pregnant or think they might be pregnant. There is a potential for serious side effects to an unborn child. Talk to your health care professional or pharmacist for more information. Do not breast-feed an infant while taking this medicine. What side effects may I notice from receiving this medicine? Side effects that you should report to your doctor or health care professional as soon as possible: -allergic reactions like skin rash, itching or hives, swelling of the face, lips, or tongue -low blood counts - this medicine may decrease the number of white blood cells, red blood cells and platelets. You may be at increased risk for infections and bleeding. -signs of infection  - fever or chills, cough, sore throat, pain or difficulty passing urine -signs of decreased platelets or bleeding - bruising, pinpoint red spots on the skin, black, tarry stools, blood in the urine -signs of decreased red blood cells - unusually weak or tired, fainting spells, lightheadedness -breathing problems -chest pain -diarrhea -feeling faint or lightheaded, falls -flushing, runny nose, sweating during infusion -mouth sores or pain -pain, swelling, redness or irritation where injected -pain, swelling, warmth in the leg -pain, tingling, numbness in the hands or feet -problems with balance, talking, walking -stomach cramps, pain -trouble passing urine or change in the amount of urine -vomiting as to be unable to hold down drinks or food -yellowing of the eyes or skin Side effects that usually do not require medical attention (report to your doctor or health care professional if they continue or are bothersome): -constipation -hair loss -headache -loss of appetite -nausea, vomiting -stomach upset This list may not describe all possible side effects. Call your doctor for medical advice about side effects. You may report side effects to FDA at 1-800-FDA-1088. Where should I keep my medicine? This drug is given in a hospital or clinic and will not be stored at home. NOTE: This sheet is a summary. It may not cover all possible information. If you have questions about this medicine, talk to your doctor, pharmacist, or health care provider.  2018 Elsevier/Gold Standard (2012-08-14 16:29:32)  Leucovorin injection What is this medicine? LEUCOVORIN (loo koe VOR in) is used to prevent  or treat the harmful effects of some medicines. This medicine is used to treat anemia caused by a low amount of folic acid in the body. It is also used with 5-fluorouracil (5-FU) to treat colon cancer. This medicine may be used for other purposes; ask your health care provider or pharmacist if you have  questions. What should I tell my health care provider before I take this medicine? They need to know if you have any of these conditions: -anemia from low levels of vitamin B-12 in the blood -an unusual or allergic reaction to leucovorin, folic acid, other medicines, foods, dyes, or preservatives -pregnant or trying to get pregnant -breast-feeding How should I use this medicine? This medicine is for injection into a muscle or into a vein. It is given by a health care professional in a hospital or clinic setting. Talk to your pediatrician regarding the use of this medicine in children. Special care may be needed. Overdosage: If you think you have taken too much of this medicine contact a poison control center or emergency room at once. NOTE: This medicine is only for you. Do not share this medicine with others. What if I miss a dose? This does not apply. What may interact with this medicine? -capecitabine -fluorouracil -phenobarbital -phenytoin -primidone -trimethoprim-sulfamethoxazole This list may not describe all possible interactions. Give your health care provider a list of all the medicines, herbs, non-prescription drugs, or dietary supplements you use. Also tell them if you smoke, drink alcohol, or use illegal drugs. Some items may interact with your medicine. What should I watch for while using this medicine? Your condition will be monitored carefully while you are receiving this medicine. This medicine may increase the side effects of 5-fluorouracil, 5-FU. Tell your doctor or health care professional if you have diarrhea or mouth sores that do not get better or that get worse. What side effects may I notice from receiving this medicine? Side effects that you should report to your doctor or health care professional as soon as possible: -allergic reactions like skin rash, itching or hives, swelling of the face, lips, or tongue -breathing problems -fever, infection -mouth  sores -unusual bleeding or bruising -unusually weak or tired Side effects that usually do not require medical attention (report to your doctor or health care professional if they continue or are bothersome): -constipation or diarrhea -loss of appetite -nausea, vomiting This list may not describe all possible side effects. Call your doctor for medical advice about side effects. You may report side effects to FDA at 1-800-FDA-1088. Where should I keep my medicine? This drug is given in a hospital or clinic and will not be stored at home. NOTE: This sheet is a summary. It may not cover all possible information. If you have questions about this medicine, talk to your doctor, pharmacist, or health care provider.  2018 Elsevier/Gold Standard (2007-08-22 16:50:29)  Fluorouracil, 5-FU injection What is this medicine? FLUOROURACIL, 5-FU (flure oh YOOR a sil) is a chemotherapy drug. It slows the growth of cancer cells. This medicine is used to treat many types of cancer like breast cancer, colon or rectal cancer, pancreatic cancer, and stomach cancer. This medicine may be used for other purposes; ask your health care provider or pharmacist if you have questions. COMMON BRAND NAME(S): Adrucil What should I tell my health care provider before I take this medicine? They need to know if you have any of these conditions: -blood disorders -dihydropyrimidine dehydrogenase (DPD) deficiency -infection (especially a virus infection such as  chickenpox, cold sores, or herpes) -kidney disease -liver disease -malnourished, poor nutrition -recent or ongoing radiation therapy -an unusual or allergic reaction to fluorouracil, other chemotherapy, other medicines, foods, dyes, or preservatives -pregnant or trying to get pregnant -breast-feeding How should I use this medicine? This drug is given as an infusion or injection into a vein. It is administered in a hospital or clinic by a specially trained health care  professional. Talk to your pediatrician regarding the use of this medicine in children. Special care may be needed. Overdosage: If you think you have taken too much of this medicine contact a poison control center or emergency room at once. NOTE: This medicine is only for you. Do not share this medicine with others. What if I miss a dose? It is important not to miss your dose. Call your doctor or health care professional if you are unable to keep an appointment. What may interact with this medicine? -allopurinol -cimetidine -dapsone -digoxin -hydroxyurea -leucovorin -levamisole -medicines for seizures like ethotoin, fosphenytoin, phenytoin -medicines to increase blood counts like filgrastim, pegfilgrastim, sargramostim -medicines that treat or prevent blood clots like warfarin, enoxaparin, and dalteparin -methotrexate -metronidazole -pyrimethamine -some other chemotherapy drugs like busulfan, cisplatin, estramustine, vinblastine -trimethoprim -trimetrexate -vaccines Talk to your doctor or health care professional before taking any of these medicines: -acetaminophen -aspirin -ibuprofen -ketoprofen -naproxen This list may not describe all possible interactions. Give your health care provider a list of all the medicines, herbs, non-prescription drugs, or dietary supplements you use. Also tell them if you smoke, drink alcohol, or use illegal drugs. Some items may interact with your medicine. What should I watch for while using this medicine? Visit your doctor for checks on your progress. This drug may make you feel generally unwell. This is not uncommon, as chemotherapy can affect healthy cells as well as cancer cells. Report any side effects. Continue your course of treatment even though you feel ill unless your doctor tells you to stop. In some cases, you may be given additional medicines to help with side effects. Follow all directions for their use. Call your doctor or health care  professional for advice if you get a fever, chills or sore throat, or other symptoms of a cold or flu. Do not treat yourself. This drug decreases your body's ability to fight infections. Try to avoid being around people who are sick. This medicine may increase your risk to bruise or bleed. Call your doctor or health care professional if you notice any unusual bleeding. Be careful brushing and flossing your teeth or using a toothpick because you may get an infection or bleed more easily. If you have any dental work done, tell your dentist you are receiving this medicine. Avoid taking products that contain aspirin, acetaminophen, ibuprofen, naproxen, or ketoprofen unless instructed by your doctor. These medicines may hide a fever. Do not become pregnant while taking this medicine. Women should inform their doctor if they wish to become pregnant or think they might be pregnant. There is a potential for serious side effects to an unborn child. Talk to your health care professional or pharmacist for more information. Do not breast-feed an infant while taking this medicine. Men should inform their doctor if they wish to father a child. This medicine may lower sperm counts. Do not treat diarrhea with over the counter products. Contact your doctor if you have diarrhea that lasts more than 2 days or if it is severe and watery. This medicine can make you more sensitive to the  sun. Keep out of the sun. If you cannot avoid being in the sun, wear protective clothing and use sunscreen. Do not use sun lamps or tanning beds/booths. What side effects may I notice from receiving this medicine? Side effects that you should report to your doctor or health care professional as soon as possible: -allergic reactions like skin rash, itching or hives, swelling of the face, lips, or tongue -low blood counts - this medicine may decrease the number of white blood cells, red blood cells and platelets. You may be at increased risk for  infections and bleeding. -signs of infection - fever or chills, cough, sore throat, pain or difficulty passing urine -signs of decreased platelets or bleeding - bruising, pinpoint red spots on the skin, black, tarry stools, blood in the urine -signs of decreased red blood cells - unusually weak or tired, fainting spells, lightheadedness -breathing problems -changes in vision -chest pain -mouth sores -nausea and vomiting -pain, swelling, redness at site where injected -pain, tingling, numbness in the hands or feet -redness, swelling, or sores on hands or feet -stomach pain -unusual bleeding Side effects that usually do not require medical attention (report to your doctor or health care professional if they continue or are bothersome): -changes in finger or toe nails -diarrhea -dry or itchy skin -hair loss -headache -loss of appetite -sensitivity of eyes to the light -stomach upset -unusually teary eyes This list may not describe all possible side effects. Call your doctor for medical advice about side effects. You may report side effects to FDA at 1-800-FDA-1088. Where should I keep my medicine? This drug is given in a hospital or clinic and will not be stored at home. NOTE: This sheet is a summary. It may not cover all possible information. If you have questions about this medicine, talk to your doctor, pharmacist, or health care provider.  2018 Elsevier/Gold Standard (2007-06-21 13:53:16)

## 2017-02-02 NOTE — Telephone Encounter (Signed)
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  I encouraged her to call back if she has other questions or I can discuss with them again on her next visit.  Truitt Merle  02/02/2017

## 2017-02-04 ENCOUNTER — Ambulatory Visit: Payer: Medicare Other | Admitting: Hematology

## 2017-02-04 ENCOUNTER — Ambulatory Visit (HOSPITAL_BASED_OUTPATIENT_CLINIC_OR_DEPARTMENT_OTHER): Payer: Medicare Other

## 2017-02-04 VITALS — BP 151/85 | HR 88 | Temp 98.4°F | Resp 16

## 2017-02-04 DIAGNOSIS — C186 Malignant neoplasm of descending colon: Secondary | ICD-10-CM

## 2017-02-04 DIAGNOSIS — C187 Malignant neoplasm of sigmoid colon: Secondary | ICD-10-CM | POA: Diagnosis not present

## 2017-02-04 DIAGNOSIS — Z95828 Presence of other vascular implants and grafts: Secondary | ICD-10-CM

## 2017-02-04 DIAGNOSIS — Z7189 Other specified counseling: Secondary | ICD-10-CM

## 2017-02-04 MED ORDER — HEPARIN SOD (PORK) LOCK FLUSH 100 UNIT/ML IV SOLN
500.0000 [IU] | Freq: Once | INTRAVENOUS | Status: AC | PRN
  Administered 2017-02-04: 500 [IU]
  Filled 2017-02-04: qty 5

## 2017-02-04 MED ORDER — HEPARIN SOD (PORK) LOCK FLUSH 100 UNIT/ML IV SOLN
500.0000 [IU] | Freq: Once | INTRAVENOUS | Status: DC
  Filled 2017-02-04: qty 5

## 2017-02-04 MED ORDER — SODIUM CHLORIDE 0.9% FLUSH
10.0000 mL | INTRAVENOUS | Status: DC | PRN
  Administered 2017-02-04: 10 mL
  Filled 2017-02-04: qty 10

## 2017-02-04 MED ORDER — SODIUM CHLORIDE 0.9% FLUSH
10.0000 mL | INTRAVENOUS | Status: DC | PRN
  Filled 2017-02-04: qty 10

## 2017-02-04 NOTE — Patient Instructions (Signed)

## 2017-02-10 ENCOUNTER — Ambulatory Visit: Payer: Self-pay | Admitting: Family Medicine

## 2017-02-13 ENCOUNTER — Encounter: Payer: Self-pay | Admitting: Hematology

## 2017-02-14 ENCOUNTER — Other Ambulatory Visit: Payer: Self-pay | Admitting: Hematology

## 2017-02-14 ENCOUNTER — Encounter: Payer: Self-pay | Admitting: Family Medicine

## 2017-02-14 ENCOUNTER — Ambulatory Visit (INDEPENDENT_AMBULATORY_CARE_PROVIDER_SITE_OTHER): Payer: Medicare Other | Admitting: Family Medicine

## 2017-02-14 VITALS — BP 118/81 | HR 94 | Temp 97.0°F | Ht 72.0 in | Wt 177.0 lb

## 2017-02-14 DIAGNOSIS — C799 Secondary malignant neoplasm of unspecified site: Secondary | ICD-10-CM

## 2017-02-14 DIAGNOSIS — Z7689 Persons encountering health services in other specified circumstances: Secondary | ICD-10-CM

## 2017-02-14 DIAGNOSIS — C186 Malignant neoplasm of descending colon: Secondary | ICD-10-CM

## 2017-02-14 DIAGNOSIS — C4492 Squamous cell carcinoma of skin, unspecified: Secondary | ICD-10-CM | POA: Diagnosis not present

## 2017-02-14 DIAGNOSIS — I10 Essential (primary) hypertension: Secondary | ICD-10-CM

## 2017-02-14 NOTE — Patient Instructions (Signed)
I value your feedback and appreciate you entrusting [place] with your care.  If you get a survey, I would appreciate your taking the time to let us know what your experience was like.  Is a pleasure to meet you today, [name].  As we discussed, plan to see me back in about a month or so with your blood pressure readings.  If you become symptomatic due to low blood pressure, please call my office and we will discuss discontinuing 1 of your blood pressure medications.  In the interim, continue to follow-up with your specialist as scheduled.  Please consider making an advanced directive/living will and discussing your CODE STATUS with Dr. Burr Medico at your next visit.   Advance Directive Advance directives are legal documents that let you make choices ahead of time about your health care and medical treatment in case you become unable to communicate for yourself. Advance directives are a way for you to communicate your wishes to family, friends, and health care providers. This can help convey your decisions about end-of-life care if you become unable to communicate. Discussing and writing advance directives should happen over time rather than all at once. Advance directives can be changed depending on your situation and what you want, even after you have signed the advance directives. If you do not have an advance directive, some states assign family decision makers to act on your behalf based on how closely you are related to them. Each state has its own laws regarding advance directives. You may want to check with your health care provider, attorney, or state representative about the laws in your state. There are different types of advance directives, such as:  Medical power of attorney.  Living will.  Do not resuscitate (DNR) or do not attempt resuscitation (DNAR) order.  Health care proxy and medical power of attorney A health care proxy, also called a health care agent, is a person who is appointed to  make medical decisions for you in cases in which you are unable to make the decisions yourself. Generally, people choose someone they know well and trust to represent their preferences. Make sure to ask this person for an agreement to act as your proxy. A proxy may have to exercise judgment in the event of a medical decision for which your wishes are not known. A medical power of attorney is a legal document that names your health care proxy. Depending on the laws in your state, after the document is written, it may also need to be:  Signed.  Notarized.  Dated.  Copied.  Witnessed.  Incorporated into your medical record.  You may also want to appoint someone to manage your financial affairs in a situation in which you are unable to do so. This is called a durable power of attorney for finances. It is a separate legal document from the durable power of attorney for health care. You may choose the same person or someone different from your health care proxy to act as your agent in financial matters. If you do not appoint a proxy, or if there is a concern that the proxy is not acting in your best interests, a court-appointed guardian may be designated to act on your behalf. Living will A living will is a set of instructions documenting your wishes about medical care when you cannot express them yourself. Health care providers should keep a copy of your living will in your medical record. You may want to give a copy to family  members or friends. To alert caregivers in case of an emergency, you can place a card in your wallet to let them know that you have a living will and where they can find it. A living will is used if you become:  Terminally ill.  Incapacitated.  Unable to communicate or make decisions.  Items to consider in your living will include:  The use or non-use of life-sustaining equipment, such as dialysis machines and breathing machines (ventilators).  A DNR or DNAR order,  which is the instruction not to use cardiopulmonary resuscitation (CPR) if breathing or heartbeat stops.  The use or non-use of tube feeding.  Withholding of food and fluids.  Comfort (palliative) care when the goal becomes comfort rather than a cure.  Organ and tissue donation.  A living will does not give instructions for distributing your money and property if you should pass away. It is recommended that you seek the advice of a lawyer when writing a will. Decisions about taxes, beneficiaries, and asset distribution will be legally binding. This process can relieve your family and friends of any concerns surrounding disputes or questions that may come up about the distribution of your assets. DNR or DNAR A DNR or DNAR order is a request not to have CPR in the event that your heart stops beating or you stop breathing. If a DNR or DNAR order has not been made and shared, a health care provider will try to help any patient whose heart has stopped or who has stopped breathing. If you plan to have surgery, talk with your health care provider about how your DNR or DNAR order will be followed if problems occur. Summary  Advance directives are the legal documents that allow you to make choices ahead of time about your health care and medical treatment in case you become unable to communicate for yourself.  The process of discussing and writing advance directives should happen over time. You can change the advance directives, even after you have signed them.  Advance directives include DNR or DNAR orders, living wills, and designating an agent as your medical power of attorney. This information is not intended to replace advice given to you by your health care provider. Make sure you discuss any questions you have with your health care provider. Document Released: 05/25/2007 Document Revised: 01/05/2016 Document Reviewed: 01/05/2016 Elsevier Interactive Patient Education  2017 [employer].

## 2017-02-14 NOTE — Progress Notes (Signed)
Subjective: DD:[facility] care, recently diagnosed w/ cancer HPI: Thomas Wagner is a 76 y.o. male presenting to clinic today for:  1. Cancer/ HTN Patient diagnosed with adenocarcinoma of the colon a couple of months ago.  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  He is currently under the care of Dr. Burr Medico and is receiving chemotherapy every 2 weeks for metastatic colon cancer.  Up to this point, he had been healthy with only PMH of hypertension.  He notes that hypertension has been well controlled on benazepril 10 mg and Norvasc 5 mg.  Denies chest pain, shortness of breath, dizziness, headaches, lower extremity edema or new neurologic changes.  Past Medical History:  Diagnosis Date  . Anemia   . Cancer ([place]) 12/2016   coloc and stomach  . History of traumatic subdural hematoma    08-05-2005  --- pt fell off roof--- per imaging right posteroparietal-right parafalcine subdural hematoma w/ concussion --- per pt no residual  . Hypertension   . Nocturia   . Penile mass   . Phimosis   . Pre-diabetes   . Wears dentures    upper   Past Surgical History:  Procedure Laterality Date  . CIRCUMCISION N/A 01/03/2017   Procedure: CIRCUMCISION ADULT/ POSSIBLE PENILE BIOPSY;  Surgeon: Kathie Rhodes, MD;  Location: [facility];  Service: Urology;  Laterality: N/A;  . COLON RESECTION N/A 01/14/2017   Procedure: LAPAROSCOPIC SIGMOIDECTOMY , OMENTECTOMY BIOPSY WITH END COLOSTOMY;  Surgeon: Leighton Ruff, MD;  Location: [facility];  Service: General;  Laterality: N/A;  . FLEXIBLE SIGMOIDOSCOPY N/A 01/13/2017   Procedure: FLEXIBLE SIGMOIDOSCOPY;  Surgeon: Doran Stabler, MD;  Location: [facility];  Service: Gastroenterology;  Laterality: N/A;  . PORTACATH PLACEMENT N/A 01/17/2017   Procedure: INSERTION PORT-A-CATH;  Surgeon: Excell Seltzer, MD;  Location: [facility];  Service: General;  Laterality: N/A;   Social History   Socioeconomic History    . Marital status: Married    Spouse name: Not on file  . Number of children: Not on file  . Years of education: Not on file  . Highest education level: Not on file  Social Needs  . Financial resource strain: Not on file  . Food insecurity - worry: Not on file  . Food insecurity - inability: Not on file  . Transportation needs - medical: Not on file  . Transportation needs - non-medical: Not on file  Occupational History  . Not on file  Tobacco Use  . Smoking status: Former Smoker    Years: 5.00    Types: Cigarettes    Last attempt to quit: 12/24/1973    Years since quitting: 43.1  . Smokeless tobacco: Former Systems developer    Types: [place] date: 12/25/1978  Substance and Sexual Activity  . Alcohol use: No  . Drug use: No  . Sexual activity: Not Currently  Other Topics Concern  . Not on file  Social History Narrative  . Not on file   Current Meds  Medication Sig  . amLODipine (NORVASC) 5 MG tablet Take 2.5 mg by mouth every evening.  . benazepril (LOTENSIN) 20 MG tablet Take 10 mg by mouth every evening.  Marland Kitchen ibuprofen (ADVIL,MOTRIN) 200 MG tablet Take 400 mg 2 (two) times daily as needed by mouth.  . lidocaine-prilocaine (EMLA) cream Apply 1 application topically as needed.  . loperamide (IMODIUM A-D) 2 MG tablet Take 2 at diarrhea onset , then 1 every 2hr until  12hrs with no BM. May take 2 every 4hrs at night. If diarrhea recurs repeat.  . megestrol (MEGACE) 40 MG/ML suspension Take 16 mLs (640 mg total) by mouth daily.  . ondansetron (ZOFRAN) 8 MG tablet Take 1 tablet (8 mg total) by mouth 2 (two) times daily as needed for refractory nausea / vomiting. Start on day 3 after chemotherapy.  Marland Kitchen oxycodone (OXY-IR) 5 MG capsule Take 1-2 capsules (5-10 mg total) every 4 (four) hours as needed by mouth.  . prochlorperazine (COMPAZINE) 10 MG tablet Take 1 tablet (10 mg total) by mouth every 6 (six) hours as needed (NAUSEA).   Family History  Problem Relation Age of Onset  .  Pancreatic cancer Mother   . Pancreatic disease Mother   . Cancer Mother 35       pancreatic cancer  . Other Daughter        Precancerous lesions on colonoscopy  . Hypertension Daughter   . Other Son        Precancerous lesion on colonoscopy  . Arrhythmia Son   . Congestive Heart Failure Father   . Valvular heart disease Sister   . Breast cancer Sister   . Myasthenia gravis Brother   . Breast cancer Sister   . Healthy Sister   . Healthy Sister   . Prostate cancer Brother 24  . Healthy Brother   . Healthy Daughter   . Healthy Son    No Known Allergies   Health Maintenance: UTD  ROS: Per HPI  Objective: Office vital signs reviewed. BP 118/81   Pulse 94   Temp (!) 97 F (36.1 C) (Oral)   Ht 6' (1.829 m)   Wt 177 lb (80.3 kg)   BMI 24.01 kg/m   Physical Examination:  General: Awake, alert, chronically ill appearing elderly male, No acute distress HEENT: Normal    Neck: No masses palpated. No lymphadenopathy    Eyes: PERRLA, extraocular movement in tact, sclera white Cardio: regular rate and rhythm, S1S2 heard, no murmurs appreciated Pulm: clear to auscultation bilaterally, no wheezes, rhonchi or rales; normal work of breathing on room air MSK: Ambulates independently.  Moves all extremities independently. Neuro: No focal neurologic deficits.  Follows commands. Psych: Mood stable, speech normal, affect flat.  Depression screen PHQ 2/9 02/14/2017  Decreased Interest 3  Down, Depressed, Hopeless 3  PHQ - 2 Score 6  Altered sleeping 0  Tired, decreased energy 3  Change in appetite 3  Feeling bad or failure about yourself  0  Moving slowly or fidgety/restless 2  Suicidal thoughts 0  PHQ-9 Score 14  Difficult doing work/chores Somewhat difficult   Assessment/ Plan: 75 y.o. male   Essential hypertension Blood pressure well controlled with benazepril 10 mg and Norvasc 5 mg.  He is asymptomatic.  I did discuss with him that should he continue to lose weight and  if his blood pressure starts to decrease further, could consider discontinuing the lisinopril so as to prevent hypotensive episodes.  He will continue to monitor his blood pressure and record this.  He will call our office for any blood pressures less than 110/60 or if he develops symptoms suggestive of hypertension.  He will follow-up in the next 4-6 weeks for recheck.  Cancer of left colon [facility]) Being followed by Dr Burr Medico and currently under chemotherapy treatment.  I did have a frank discussion with the patient and his wife with regards to goals of care discussion.  He does not have a living will in  place nor have they discussed his CODE STATUS should in the emergency happen.  I did provide them with a packet for advance directive and encouraged them to discuss this even further with his oncologist.  Patient voiced great appreciation for information and will discuss care wishes with his wife.  Encounter to establish care with new doctor Previously followed by Dr. Edrick Oh.  His records are available in care everywhere.  We will check the vaccination database to ensure that he is up-to-date on vaccines.  Metastatic disease ([place])  SCC (squamous cell carcinoma)  Additionally, his PHQ 9 was noted to be elevated today.  I suspect that this is in the setting of recent diagnoses of cancer.  He is currently on Megace for appetite.  Could consider adding mirtazapine for depressive symptoms and also for appetite.  We will plan to address this again at next visit in 4 weeks if PHQ 9 is persistently elevated and [place], [place] [phone]

## 2017-02-14 NOTE — Assessment & Plan Note (Signed)
Being followed by Dr Burr Medico and currently under chemotherapy treatment.  I did have a frank discussion with the patient and his wife with regards to goals of care discussion.  He does not have a living will in place nor have they discussed his CODE STATUS should in the emergency happen.  I did provide them with a packet for advance directive and encouraged them to discuss this even further with his oncologist.  Patient voiced great appreciation for information and will discuss care wishes with his wife.

## 2017-02-14 NOTE — Assessment & Plan Note (Signed)
Blood pressure well controlled with benazepril 10 mg and Norvasc 5 mg.  He is asymptomatic.  I did discuss with him that should he continue to lose weight and if his blood pressure starts to decrease further, could consider discontinuing the lisinopril so as to prevent hypotensive episodes.  He will continue to monitor his blood pressure and record this.  He will call our office for any blood pressures less than 110/60 or if he develops symptoms suggestive of hypertension.  He will follow-up in the next 4-6 weeks for recheck.

## 2017-02-14 NOTE — Telephone Encounter (Signed)
Dr Burr Medico called pt/wife.

## 2017-02-16 ENCOUNTER — Other Ambulatory Visit (HOSPITAL_BASED_OUTPATIENT_CLINIC_OR_DEPARTMENT_OTHER): Payer: Medicare Other

## 2017-02-16 ENCOUNTER — Ambulatory Visit (HOSPITAL_BASED_OUTPATIENT_CLINIC_OR_DEPARTMENT_OTHER): Payer: Medicare Other | Admitting: Nurse Practitioner

## 2017-02-16 ENCOUNTER — Ambulatory Visit (HOSPITAL_BASED_OUTPATIENT_CLINIC_OR_DEPARTMENT_OTHER): Payer: Medicare Other

## 2017-02-16 ENCOUNTER — Telehealth: Payer: Self-pay | Admitting: Hematology

## 2017-02-16 ENCOUNTER — Encounter: Payer: Self-pay | Admitting: Nurse Practitioner

## 2017-02-16 VITALS — BP 145/95 | HR 94 | Temp 98.8°F | Resp 20 | Ht 72.0 in | Wt 178.7 lb

## 2017-02-16 DIAGNOSIS — C187 Malignant neoplasm of sigmoid colon: Secondary | ICD-10-CM

## 2017-02-16 DIAGNOSIS — Z5111 Encounter for antineoplastic chemotherapy: Secondary | ICD-10-CM | POA: Diagnosis not present

## 2017-02-16 DIAGNOSIS — Z95828 Presence of other vascular implants and grafts: Secondary | ICD-10-CM

## 2017-02-16 DIAGNOSIS — C786 Secondary malignant neoplasm of retroperitoneum and peritoneum: Secondary | ICD-10-CM | POA: Diagnosis not present

## 2017-02-16 DIAGNOSIS — Z452 Encounter for adjustment and management of vascular access device: Secondary | ICD-10-CM

## 2017-02-16 DIAGNOSIS — Z7189 Other specified counseling: Secondary | ICD-10-CM

## 2017-02-16 DIAGNOSIS — R630 Anorexia: Secondary | ICD-10-CM

## 2017-02-16 DIAGNOSIS — R5383 Other fatigue: Secondary | ICD-10-CM | POA: Diagnosis not present

## 2017-02-16 DIAGNOSIS — R197 Diarrhea, unspecified: Secondary | ICD-10-CM

## 2017-02-16 DIAGNOSIS — C186 Malignant neoplasm of descending colon: Secondary | ICD-10-CM

## 2017-02-16 DIAGNOSIS — D649 Anemia, unspecified: Secondary | ICD-10-CM

## 2017-02-16 DIAGNOSIS — Z5112 Encounter for antineoplastic immunotherapy: Secondary | ICD-10-CM

## 2017-02-16 LAB — CBC WITH DIFFERENTIAL/PLATELET
BASO%: 1.1 % (ref 0.0–2.0)
Basophils Absolute: 0.1 10*3/uL (ref 0.0–0.1)
EOS%: 5.5 % (ref 0.0–7.0)
Eosinophils Absolute: 0.4 10*3/uL (ref 0.0–0.5)
HEMATOCRIT: 35.4 % — AB (ref 38.4–49.9)
HEMOGLOBIN: 11.7 g/dL — AB (ref 13.0–17.1)
LYMPH%: 9.3 % — ABNORMAL LOW (ref 14.0–49.0)
MCH: 27.3 pg (ref 27.2–33.4)
MCHC: 33.2 g/dL (ref 32.0–36.0)
MCV: 82.4 fL (ref 79.3–98.0)
MONO#: 0.9 10*3/uL (ref 0.1–0.9)
MONO%: 11.9 % (ref 0.0–14.0)
NEUT#: 5.8 10*3/uL (ref 1.5–6.5)
NEUT%: 72.2 % (ref 39.0–75.0)
Platelets: 336 10*3/uL (ref 140–400)
RBC: 4.3 10*6/uL (ref 4.20–5.82)
RDW: 14.8 % — AB (ref 11.0–14.6)
WBC: 8 10*3/uL (ref 4.0–10.3)
lymph#: 0.7 10*3/uL — ABNORMAL LOW (ref 0.9–3.3)

## 2017-02-16 LAB — COMPREHENSIVE METABOLIC PANEL
ALT: 9 U/L (ref 0–55)
ANION GAP: 11 meq/L (ref 3–11)
AST: 13 U/L (ref 5–34)
Albumin: 2.9 g/dL — ABNORMAL LOW (ref 3.5–5.0)
Alkaline Phosphatase: 71 U/L (ref 40–150)
BUN: 15.7 mg/dL (ref 7.0–26.0)
CHLORIDE: 102 meq/L (ref 98–109)
CO2: 21 meq/L — AB (ref 22–29)
CREATININE: 1 mg/dL (ref 0.7–1.3)
Calcium: 9 mg/dL (ref 8.4–10.4)
EGFR: >60 mL/min/{1.73_m2} (ref >60)
GLUCOSE: 95 mg/dL (ref 70–140)
Potassium: 3.8 mEq/L (ref 3.5–5.1)
SODIUM: 133 meq/L — AB (ref 136–145)
Total Bilirubin: 0.37 mg/dL (ref 0.20–1.20)
Total Protein: 6.8 g/dL (ref 6.4–8.3)

## 2017-02-16 LAB — CEA (IN HOUSE-CHCC): CEA (CHCC-IN HOUSE): 4.79 ng/mL (ref 0.00–5.00)

## 2017-02-16 LAB — MAGNESIUM: Magnesium: 2 mg/dl (ref 1.5–2.5)

## 2017-02-16 LAB — UA PROTEIN, DIPSTICK - CHCC: PROTEIN: 30 mg/dL

## 2017-02-16 LAB — FERRITIN: FERRITIN: 464 ng/mL — AB (ref 22–316)

## 2017-02-16 MED ORDER — SODIUM CHLORIDE 0.9% FLUSH
10.0000 mL | INTRAVENOUS | Status: DC | PRN
  Administered 2017-02-16: 10 mL via INTRAVENOUS
  Filled 2017-02-16: qty 10

## 2017-02-16 MED ORDER — LEUCOVORIN CALCIUM INJECTION 350 MG
400.0000 mg/m2 | Freq: Once | INTRAMUSCULAR | Status: AC
  Administered 2017-02-16: 824 mg via INTRAVENOUS
  Filled 2017-02-16: qty 41.2

## 2017-02-16 MED ORDER — IRINOTECAN HCL CHEMO INJECTION 100 MG/5ML
150.0000 mg/m2 | Freq: Once | INTRAVENOUS | Status: AC
  Administered 2017-02-16: 300 mg via INTRAVENOUS
  Filled 2017-02-16: qty 15

## 2017-02-16 MED ORDER — SODIUM CHLORIDE 0.9 % IV SOLN
Freq: Once | INTRAVENOUS | Status: AC
  Administered 2017-02-16: 13:00:00 via INTRAVENOUS

## 2017-02-16 MED ORDER — ATROPINE SULFATE 1 MG/ML IJ SOLN
INTRAMUSCULAR | Status: AC
  Filled 2017-02-16: qty 1

## 2017-02-16 MED ORDER — SODIUM CHLORIDE 0.9 % IV SOLN
6.0000 mg/kg | Freq: Once | INTRAVENOUS | Status: AC
  Administered 2017-02-16: 500 mg via INTRAVENOUS
  Filled 2017-02-16: qty 20

## 2017-02-16 MED ORDER — SODIUM CHLORIDE 0.9 % IV SOLN
2200.0000 mg/m2 | INTRAVENOUS | Status: DC
  Administered 2017-02-16: 4550 mg via INTRAVENOUS
  Filled 2017-02-16: qty 91

## 2017-02-16 MED ORDER — PALONOSETRON HCL INJECTION 0.25 MG/5ML
0.2500 mg | Freq: Once | INTRAVENOUS | Status: AC
  Administered 2017-02-16: 0.25 mg via INTRAVENOUS

## 2017-02-16 MED ORDER — DEXAMETHASONE SODIUM PHOSPHATE 10 MG/ML IJ SOLN
10.0000 mg | Freq: Once | INTRAMUSCULAR | Status: AC
  Administered 2017-02-16: 10 mg via INTRAVENOUS

## 2017-02-16 MED ORDER — OXYCODONE HCL 5 MG PO CAPS: 5.0000 mg | ORAL_CAPSULE | ORAL | 0 refills | Status: AC | PRN

## 2017-02-16 MED ORDER — DEXAMETHASONE SODIUM PHOSPHATE 10 MG/ML IJ SOLN
INTRAMUSCULAR | Status: AC
  Filled 2017-02-16: qty 1

## 2017-02-16 MED ORDER — MIRTAZAPINE 15 MG PO TABS: 15.0000 mg | ORAL_TABLET | Freq: Every day | ORAL | 1 refills | Status: AC

## 2017-02-16 MED ORDER — ATROPINE SULFATE 1 MG/ML IJ SOLN
0.5000 mg | Freq: Once | INTRAMUSCULAR | Status: AC | PRN
  Administered 2017-02-16: 0.5 mg via INTRAVENOUS

## 2017-02-16 MED ORDER — PALONOSETRON HCL INJECTION 0.25 MG/5ML
INTRAVENOUS | Status: AC
  Filled 2017-02-16: qty 5

## 2017-02-16 NOTE — Progress Notes (Addendum)
Thomas Wagner  [phone] [phone]  Clinic Follow up Note   Patient Care Team: Timmothy Euler, MD as PCP - General (Family Medicine) Kathie Rhodes, MD as Consulting Physician (Urology) Truitt Merle, MD as Consulting Physician (Hematology) Leighton Ruff, MD as Consulting Physician (General Surgery) 02/16/2017  CHIEF COMPLAINT: F/u metastatic left colon cancer   SUMMARY OF ONCOLOGIC HISTORY: Oncology History   Cancer Staging Cancer of left colon [facility]) Staging form: Colon and Rectum, AJCC 8th Edition - Pathologic stage from 01/14/2017: Stage IVC (pT4a, pN1a, pM1c) - Signed by Truitt Merle, MD on 01/30/2017       Cancer of left colon ([place])   01/11/2017 Imaging    CT AP W Contrast 01/11/17  IMPRESSION: 1. Irregular annular 6.2 x 3.6 cm mass in the sigmoid colon suspicious for primary colonic malignancy. 2. Multiple mildly enlarged lymph nodes in the sigmoid mesenteries suspicious for locoregional nodal metastases. 3. Extensive soft tissue caking throughout the omentum. Smooth peritoneal thickening and enhancement throughout the peritoneal cavity. Moderate volume ascites. 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 4. Small dependent left pleural effusion. 5.  Aortic Atherosclerosis (ICD10-I70.0).  Coronary atherosclerosis.      01/13/2017 Procedure     Flexible sigmoidoscopy Dr. Loletha Carrow 01/13/2017  Findings: A fungating, infiltrative and ulcerated completely obstructing mass was found in the recto-sigmoid colon (about 20 cm from anal verge). The mass was circumferential. Mucosa was biopsied with a cold forceps for histology. One specimen bottle was sent to pathology. The exam was otherwise without abnormality. - Likely malignant completely obstructing tumor in the recto-sigmoid colon. Biopsied. - The examination was otherwise normal.        01/13/2017 Initial Biopsy    Diagnosis 01/13/17  Colon, biopsy,  sigmoid mass - POORLY DIFFERENTIATED ADENOCARCINOMA WITH SIGNET RING CELLS. Microscopic Comment Dr. Saralyn Pilar has reviewed the case. Dr. Loletha Carrow was paged on 01/14/2017.      01/14/2017 Surgery    LAPAROSCOPIC SIGMOIDECTOMY , OMENTECTOMY BIOPSY WITH END COLOSTOMY by Dr. Marcello Moores on 01/14/17      01/14/2017 Pathology Results    Diagnosis 01/14/17  1. Omentum, resection for tumor - METASTATIC ADENOCARCINOMA. 2. Colon, segmental resection for tumor, sigmoid - INVASIVE ADENOCARCINOMA WITH EXTRACELLULAR MUCIN AND SIGNET RING CELLS, POORLY DIFFERENTIATED, SPANNING 4 CM. - TUMOR INVOLVES SEROSA. - RESECTION MARGINS ARE NEGATIVE. - METASTATIC CARCINOMA IN ONE OF FIVE LYMPH NODES (1/5). - SATELLITE NODULES. - SEE ONCOLOGY TABLE.      01/15/2017 Imaging    CT Chest W Contrast  IMPRESSION: 1. No evidence of metastatic disease in the chest. 2. Small left pleural effusion and trace right pleural effusion without obvious pleural metastatic disease. 3. Coronary atherosclerosis in a 3 vessel distribution. 4. Free intraperitoneal air in the upper abdomen related to recent abdominal surgery.      01/18/2017 Initial Diagnosis    Cancer of left colon ([place])       Chemotherapy    PENDING FOLFIRI every 2 weeks to start on 02/03/17       CURRENT THERAPY: FOLFIRI q2 weeks beginning 02/03/17; Panitumumab added with cycle 2   INTERVAL HISTORY: Thomas Wagner returns today as scheduled for f/u prior to cycle 2 FOLFIRI. He experienced delayed onset of fatigue during second week after chemo that has not resolved; spends more than 50% of his day resting or lying on sofa/bed. He does activities when he has to but forces himself. He had loose colostomy output after chemo but not excessive amount, requiring bag  emptying 1-2 times per day. Did not need imodium. Required miralalax and prunes yesterday with subsequent successful BM. Mild nausea on day 3-4 without emesis, took zofran and compazine with relief. Still has  mild-moderate lower abdominal pain for which he takes approx 1 oxycodone per day. He is requesting refill today; pain currently 3/10. His appetite remains low, took megace but stopped when he nearly ran out. Does not notice decrease in appetite from when he was taking it. Drinks <1 ensure boost per day. Has shortness of breath with exertion and occasionally at rest, no associated cough, chest pain, palpitation.   REVIEW OF SYSTEMS:   Constitutional: Denies fevers, chills (+) moderate fatigue (+) 5 lbs abnormal weight loss (+) decreased appetite  Eyes: Denies blurriness of vision Ears, nose, mouth, throat, and face: Denies mucositis or sore throat Respiratory: Denies cough or wheezes (+) DOE, occasional dyspnea at rest Cardiovascular: Denies palpitation, chest discomfort or lower extremity swelling Gastrointestinal:  Denies vomiting, diarrhea, heartburn or change in bowel habits (+) mild nausea day 3-4 after chemo, resolved with zofran and compazine (+) loose stool after chemo x1 week without increased GI output (+) mild constipation, required miralax and prunes last night, had subsequent successful BM (+) lower abdominal pain, 3/10 (+) bloating (+) left abdomen colostomy Skin: Denies abnormal skin rashes Lymphatics: Denies new lymphadenopathy or easy bruising Neurological:Denies numbness, tingling or new weaknesses Behavioral/Psych: (+) Mood is "slow" but stable (+) sleeps well  All other systems were reviewed with the patient and are negative.  MEDICAL HISTORY:  Past Medical History:  Diagnosis Date  . Anemia   . Cancer ([place]) 12/2016   coloc and stomach  . History of traumatic subdural hematoma    08-05-2005  --- pt fell off roof--- per imaging right posteroparietal-right parafalcine subdural hematoma w/ concussion --- per pt no residual  . Hypertension   . Nocturia   . Penile mass   . Phimosis   . Pre-diabetes   . Wears dentures    upper    SURGICAL HISTORY: Past Surgical History:    Procedure Laterality Date  . CIRCUMCISION N/A 01/03/2017   Procedure: CIRCUMCISION ADULT/ POSSIBLE PENILE BIOPSY;  Surgeon: Kathie Rhodes, MD;  Location: [facility];  Service: Urology;  Laterality: N/A;  . COLON RESECTION N/A 01/14/2017   Procedure: LAPAROSCOPIC SIGMOIDECTOMY , OMENTECTOMY BIOPSY WITH END COLOSTOMY;  Surgeon: Leighton Ruff, MD;  Location: [facility];  Service: General;  Laterality: N/A;  . FLEXIBLE SIGMOIDOSCOPY N/A 01/13/2017   Procedure: FLEXIBLE SIGMOIDOSCOPY;  Surgeon: Doran Stabler, MD;  Location: [facility];  Service: Gastroenterology;  Laterality: N/A;  . PORTACATH PLACEMENT N/A 01/17/2017   Procedure: INSERTION PORT-A-CATH;  Surgeon: Excell Seltzer, MD;  Location: [facility];  Service: General;  Laterality: N/A;    I have reviewed the social history and family history with the patient and they are unchanged from previous note.  ALLERGIES:  has No Known Allergies.  MEDICATIONS:  Current Outpatient Medications  Medication Sig Dispense Refill  . amLODipine (NORVASC) 5 MG tablet Take 2.5 mg by mouth every evening.    . benazepril (LOTENSIN) 20 MG tablet Take 10 mg by mouth every evening.    Marland Kitchen ibuprofen (ADVIL,MOTRIN) 200 MG tablet Take 400 mg 2 (two) times daily as needed by mouth.    . lidocaine-prilocaine (EMLA) cream Apply 1 application topically as needed. 30 g 0  . loperamide (IMODIUM A-D) 2 MG tablet Take 2 at diarrhea onset , then 1 every 2hr until 12hrs  with no BM. May take 2 every 4hrs at night. If diarrhea recurs repeat. 100 tablet 1  . ondansetron (ZOFRAN) 8 MG tablet Take 1 tablet (8 mg total) by mouth 2 (two) times daily as needed for refractory nausea / vomiting. Start on day 3 after chemotherapy. 30 tablet 1  . oxycodone (OXY-IR) 5 MG capsule Take 1-2 capsules (5-10 mg total) by mouth every 4 (four) hours as needed. 30 capsule 0  . prochlorperazine (COMPAZINE) 10 MG tablet Take 1 tablet (10 mg total) by mouth every 6 (six) hours as  needed (NAUSEA). 30 tablet 1  . megestrol (MEGACE) 40 MG/ML suspension Take 16 mLs (640 mg total) by mouth daily. 240 mL 1  . mirtazapine (REMERON) 15 MG tablet Take 1 tablet (15 mg total) by mouth at bedtime. 30 tablet 1   No current facility-administered medications for this visit.    Facility-Administered Medications Ordered in Other Visits  Medication Dose Route Frequency Provider Last Rate Last Dose  . fluorouracil (ADRUCIL) 4,550 mg in sodium chloride 0.9 % 59 mL chemo infusion  2,200 mg/m2 (Treatment Plan Recorded) Intravenous 1 day or 1 dose Truitt Merle, MD        PHYSICAL EXAMINATION: ECOG PERFORMANCE STATUS: 2-3  Vitals:   02/16/17 1104  BP: (!) 145/95  Pulse: 94  Resp: 20  Temp: 98.8 F (37.1 C)  SpO2: 97%   Filed Weights   02/16/17 1104  Weight: 178 lb 11.2 oz (81.1 kg)    GENERAL:alert, no distress and comfortable SKIN: skin color, texture, turgor are normal, no rashes or significant lesions EYES: normal, Conjunctiva are pink and non-injected, sclera clear OROPHARYNX:no exudate, no erythema and lips, buccal mucosa, and tongue normal  NECK: supple, thyroid normal size, non-tender, without nodularity LYMPH:  no palpable cervical, supraclavicular, axillary, or inguinal lymphadenopathy LUNGS: clear to auscultation bilaterally with normal breathing effort HEART: regular rate & rhythm and no murmurs and no lower extremity edema ABDOMEN:abdomen soft and non-tender (+) decreased bowel sounds (+) mild distention consistent with ascites Musculoskeletal:no cyanosis of digits and no clubbing  NEURO: alert & oriented x 3 with fluent speech, no focal motor/sensory deficits PAC without erythema   LABORATORY DATA:  I have reviewed the data as listed CBC Latest Ref Rng & Units 02/16/2017 02/02/2017 01/27/2017  WBC 4.0 - 10.3 10e3/uL 8.0 9.7 10.8(H)  Hemoglobin 13.0 - 17.1 g/dL 11.7(L) 11.8(L) 12.5(L)  Hematocrit 38.4 - 49.9 % 35.4(L) 35.6(L) 37.4(L)  Platelets 140 - 400  10e3/uL 336 532(H) 464(H)    CMP Latest Ref Rng & Units 02/16/2017 02/02/2017 01/27/2017  Glucose 70 - 140 mg/dl 95 115 135  BUN 7.0 - 26.0 mg/dL 15.7 13.8 12.2  Creatinine 0.7 - 1.3 mg/dL 1.0 0.9 1.0  Sodium 136 - 145 mEq/L 133(L) 137 134(L)  Potassium 3.5 - 5.1 mEq/L 3.8 4.1 4.2  Chloride 101 - 111 mmol/L - - -  CO2 22 - 29 mEq/L 21(L) 22 26  Calcium 8.4 - 10.4 mg/dL 9.0 9.1 9.1  Total Protein 6.4 - 8.3 g/dL 6.8 7.2 7.1  Total Bilirubin 0.20 - 1.20 mg/dL 0.37 0.31 0.34  Alkaline Phos 40 - 150 U/L 71 71 78  AST 5 - 34 U/L 13 14 14   ALT 0 - 55 U/L 9 11 12    CEA 01/14/17 3.2 (baseline) 01/27/17 4.22 02/16/17 4.79  PATHOLOGY   Diagnosis 01/14/17  1. Omentum, resection for tumor - METASTATIC ADENOCARCINOMA. 2. Colon, segmental resection for tumor, sigmoid - INVASIVE ADENOCARCINOMA WITH EXTRACELLULAR MUCIN  AND SIGNET RING CELLS, POORLY DIFFERENTIATED, SPANNING 4 CM. - TUMOR INVOLVES SEROSA. - RESECTION MARGINS ARE NEGATIVE. - METASTATIC CARCINOMA IN ONE OF FIVE LYMPH NODES (1/5). - SATELLITE NODULES. - SEE ONCOLOGY TABLE. Microscopic Comment 2. COLON AND RECTUM (INCLUDING TRANS-ANAL RESECTION): Specimen: Sigmoid colon. Procedure: Segmental resection. Tumor site: Sigmoid. Specimen integrity: Intact. Macroscopic tumor perforation: Not identified. Invasive tumor: Maximum size: 4 cm. Histologic type(s): Invasive adenocarcinoma with extracellular mucin and signet ring cells. Histologic grade and differentiation: G3: poorly differentiated/high grade Type of polyp in which invasive carcinoma arose: N/A. Microscopic extension of invasive tumor: Through serosa. Lymph-Vascular invasion: Present. Peri-neural invasion: Not identified. Tumor deposit(s) (discontinuous extramural extension): Present. Resection margins: Proximal margin: Negative. Distal margin: Negative. Circumferential (radial) (posterior ascending, posterior descending; lateral 1 of 3 Supplemental  copy SUPPLEMENTAL for TANNOR, PYON E [phone]) Microscopic Comment(continued) and posterior mid-rectum; and entire lower 1/3 rectum): N/A. Mesenteric margin (sigmoid and transverse): Negative. Distance closest margin (if all above margins negative): 3 cm (mesenteric). Treatment effect (neo-adjuvant therapy): N/A. Additional polyp(s): None. Non-neoplastic findings: None. Lymph nodes: number examined 5; number positive: 1 Pathologic Staging: pT4a, pNa, pM1a Ancillary studies: MSI and MMR will be ordered. JULIA MANNY   Diagnosis 01/13/17  Colon, biopsy, sigmoid mass - POORLY DIFFERENTIATED ADENOCARCINOMA WITH SIGNET RING CELLS. Microscopic Comment Dr. Saralyn Pilar has reviewed the case. Dr. Loletha Carrow was paged on 01/14/2017.   Diagnosis 01/03/17  Foreskin, adult, with mass - INVASIVE SQUAMOUS CELL CARCINOMA, WELL DIFFERENTIATED, SPANNING 1.3 CM. - TUMOR INVADES 0.2 CM. - TUMOR IS PRESENT AT ONE OF THE LATERAL INKED MARGINS. - DEEP MARGINS ARE NEGATIVE. - BALANITIS XEROTIC OBLITERANS. Microscopic Comment Dr. Lyndon Code has reviewed the case.  PROCEDURES   Flexible sigmoidoscopy Dr. Danis11/15/2018 Findings: A fungating, infiltrative and ulcerated completely obstructing mass was found in the recto-sigmoid colon (about 20 cm from anal verge). The mass was circumferential. Mucosa was biopsied with a cold forceps for histology. One specimen bottle was sent to pathology. The exam was otherwise without abnormality. - Likely malignant completely obstructing tumor in the recto-sigmoid colon. Biopsied. - The examination was otherwise normal.   RADIOGRAPHIC STUDIES: I have personally reviewed the radiological images as listed and agreed with the findings in the report. No results found.   ASSESSMENT & PLAN: Thomas Wagner is a 76 y.o. caucasian male without significant past medical history, except hypertension.  1. Left colon Cancer, pT4aN1aM1c, with peritoneum metastatic,  MSI-stable, KRAS/NRAS/BRAF Wildtype 2. Low appetite, weight loss 3. 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 Anemia 5. Goals of care discussion 6. Lower abdominal pain 7. Fatigue  8. Loose stool without increased GI output   Thomas Wagner appears stable today. He experienced moderate fatigue with cycle 1 which has not significant improved, related to chemotherapy and his disease. I recommend increasing ensure/boost to 2-3 bottles per day, increase eating, and trying to be active. He did not notice much improvement in appetite on megace. I will start remeron to boost his appetite and improve his mood. Sent urgent message to dietician to see in infusion and follow closely to combat further weight loss. I refilled oxycodone for mild-moderate lower abdominal pain.   This was a shared visit with Dr. Burr Medico who reviewed [facility] report and gave family a copy. He is KRAS wildtype, he is a candidate for biological agent. She recommends Panitumumab, will be added with cycle 2; reviewed possible side effects including skin rash. He agrees to proceed. Labs reviewed, mild anemia on CBC, Cmet, Mg, CEA  are stable; physical exam notable for mild abdominal distention secondary to moderate volume ascites. We discussed possible need to repeat paracentesis but will monitor for now. Due to prolonged fatigue and weight loss will further reduce FOLFIRI chemotherapy today. Return in 2 weeks for f/u and cycle 3 FOLFIRI/panitumumab  PLAN -Prescription for remeron, refilled oxycodone  -Increase ensure/boost, 2-3 per day, increase po intake, stay active -Dietician f/u in infusion today or soon, sent urgent message  -Return in 2 weeks for f/u and cycle 3 FOLFIRI/panitumumab   All questions were answered. The patient knows to call the clinic with any problems, questions or concerns. No barriers to learning was detected.     Alla Feeling, NP 02/16/17   Addendum  I have seen the patient, examined him.  I agree with the assessment and and plan and have edited the notes.   Thomas Wagner tolerated the first cycle FOLFIRI not very well, will slightly decrease dose again. I reviewed his FO result, which showed KRAS/NRAS/BRAS wild type, and MSI-stable. So immunotherapy is not an option for now. But he would greatly benefit from EGFR inhibitor, and I recommended adding panitumumab to his chemo, starting today.  Potential benefits and side effects, especially skin rash and diarrhea, and its management were discussed with patient in details, he agrees to proceed.  I will see him back in 2 weeks.  Truitt Merle  02/16/2017

## 2017-02-16 NOTE — Patient Instructions (Signed)
Panitumumab Solution for Injection What is this medicine? PANITUMUMAB (pan i TOOM ue mab) is a monoclonal antibody. It is used to treat colorectal cancer. This medicine may be used for other purposes; ask your health care provider or pharmacist if you have questions. COMMON BRAND NAME(S): Vectibix What should I tell my health care provider before I take this medicine? They need to know if you have any of these conditions: -eye disease, vision problems -low levels of calcium, magnesium, or potassium in the blood -lung or breathing disease, like asthma -skin conditions or sensitivity -an unusual or allergic reaction to panitumumab, other medicines, foods, dyes, or preservatives -pregnant or trying to get pregnant -breast-feeding How should I use this medicine? This drug is given as an infusion into a vein. It is administered in a hospital or clinic by a specially trained health care professional. Talk to your pediatrician regarding the use of this medicine in children. Special care may be needed. Overdosage: If you think you have taken too much of this medicine contact a poison control center or emergency room at once. NOTE: This medicine is only for you. Do not share this medicine with others. What if I miss a dose? It is important not to miss your dose. Call your doctor or health care professional if you are unable to keep an appointment. What may interact with this medicine? Do not take this medicine with any of the following medications: -bevacizumab This list may not describe all possible interactions. Give your health care provider a list of all the medicines, herbs, non-prescription drugs, or dietary supplements you use. Also tell them if you smoke, drink alcohol, or use illegal drugs. Some items may interact with your medicine. What should I watch for while using this medicine? Visit your doctor for checks on your progress. This drug may make you feel generally unwell. This is not  uncommon, as chemotherapy can affect healthy cells as well as cancer cells. Report any side effects. Continue your course of treatment even though you feel ill unless your doctor tells you to stop. This medicine can make you more sensitive to the sun. Keep out of the sun while receiving this medicine and for 2 months after the last dose. If you cannot avoid being in the sun, wear protective clothing and use sunscreen. Do not use sun lamps or tanning beds/booths. In some cases, you may be given additional medicines to help with side effects. Follow all directions for their use. Call your doctor or health care professional for advice if you get a fever, chills or sore throat, or other symptoms of a cold or flu. Do not treat yourself. This drug decreases your body's ability to fight infections. Try to avoid being around people who are sick. Avoid taking products that contain aspirin, acetaminophen, ibuprofen, naproxen, or ketoprofen unless instructed by your doctor. These medicines may hide a fever. Do not become pregnant while taking this medicine and for 2 months after the last dose. Women should inform their doctor if they wish to become pregnant or think they might be pregnant. There is a potential for serious side effects to an unborn child. Talk to your health care professional or pharmacist for more information. Do not breast-feed an infant while taking this medicine or for 2 months after the last dose. What side effects may I notice from receiving this medicine? Side effects that you should report to your doctor or health care professional as soon as possible: -allergic reactions like skin rash, itching   or hives, swelling of the face, lips, or tongue -breathing problems -changes in vision -eye pain -fast, irregular heartbeat -fever, chills -mouth sores -red spots on the skin -redness, blistering, peeling or loosening of the skin, including inside the mouth -signs and symptoms of kidney injury  like trouble passing urine or change in the amount of urine -signs and symptoms of low blood pressure like dizziness; feeling faint or lightheaded, falls; unusually weak or tired -signs of low calcium like fast heartbeat, muscle cramps or muscle pain; pain, tingling, numbness in the hands or feet; seizures -signs and symptoms of low magnesium like muscle cramps, pain, or weakness; tremors; seizures; or fast, irregular heartbeat -signs and symptoms of low potassium like muscle cramps or muscle pain; chest pain; dizziness; feeling faint or lightheaded, falls; palpitations; breathing problems; or fast, irregular heartbeat -swelling of the ankles, feet, hands Side effects that usually do not require medical attention (report to your doctor or health care professional if they continue or are bothersome): -changes in skin like acne, cracks, skin dryness -diarrhea -eyelash growth -headache -mouth sores -nail changes -nausea, vomiting This list may not describe all possible side effects. Call your doctor for medical advice about side effects. You may report side effects to FDA at 1-800-FDA-1088. Where should I keep my medicine? This drug is given in a hospital or clinic and will not be stored at home. NOTE: This sheet is a summary. It may not cover all possible information. If you have questions about this medicine, talk to your doctor, pharmacist, or health care provider.  2018 Elsevier/Gold Standard (2015-09-05 16:45:04)  

## 2017-02-16 NOTE — Progress Notes (Signed)
Nutrition Assessment   Reason for Assessment:   Referral from [place] regarding weight loss, poor appetite  ASSESSMENT:  76 year old male with metastatic colon cancer.  Patient s/p sigmoidectomy with colostomy and with peritoneal carcinomatosis.  Patient currently on folfiri and starting vectibix today.  Past medical history of anemia, HTN, DM  Met with patient and wife during infusion this pm.  Patient reports that appetite has been decreased for about 6-8 weeks.  Reports typically eats egg, sausage or bacon with toast and coffee for breakfast most mornings.  Then snacks during the day, or "I eat what I feel like."  Reports he eats hard boiled eggs or chicken salad sandwich or hot dog.  Last night ate baked potato with butter and ham sandwich.  Reports that he drinks boost or equate shakes but not every day.  Reports mild nausea.  Reports loose output with last chemotherapy but recently had to take something to move bowels.    Nutrition Focused Physical Exam: deferred, patient wearing jacket, fully clothed in infusion room  Medications: remeron added today, zofran, compazine  Labs: reviewed  Anthropometrics:   Height: 72 inches Weight: 178 lb 11.2 oz UBW: Patient reports has lost about 10 lb since diagnosis, typically runs in 188lb BMI: 24  5% weight loss in the last month, significant  Estimated Energy Needs  Kcals: 2400-2800 calories/d Protein: 120-140 g/d Fluid: > 2.4 L/d  NUTRITION DIAGNOSIS: Moderate malnutrition related to cancer and cancer treatments as evidenced by 5% weight loss in 1 month and eating < 75% of energy needs for > 7 days.   MALNUTRITION DIAGNOSIS: Patient meets criteria for moderate malnutrition in acute illness, likely progressing to chronic illness as evidenced by 5% weight loss in 1 month and eating < 75% of energy needs for > 7 days.   INTERVENTION:   Discussed ways to increase calories and protein. Fact sheet given. Discussed oral nutrition  supplements and difference between supplements.  Encouraged high calorie shakes at least 2-3 per day.   Coupons given today Encouraged patient to utilize nausea medication    MONITORING, EVALUATION, GOAL: patient will consume adequate calories and protein to maintain weight   NEXT VISIT: Wednesday, January 16 during infusion  Kendyl Festa B. Zenia Resides, [place], [place] Registered Dietitian [phone] (pager)

## 2017-02-16 NOTE — Patient Instructions (Signed)
Implanted Port Home Guide An implanted port is a type of central line that is placed under the skin. Central lines are used to provide IV access when treatment or nutrition needs to be given through a person's veins. Implanted ports are used for long-term IV access. An implanted port may be placed because:  You need IV medicine that would be irritating to the small veins in your hands or arms.  You need long-term IV medicines, such as antibiotics.  You need IV nutrition for a long period.  You need frequent blood draws for lab tests.  You need dialysis.  Implanted ports are usually placed in the chest area, but they can also be placed in the upper arm, the abdomen, or the leg. An implanted port has two main parts:  Reservoir. The reservoir is round and will appear as a small, raised area under your skin. The reservoir is the part where a needle is inserted to give medicines or draw blood.  Catheter. The catheter is a thin, flexible tube that extends from the reservoir. The catheter is placed into a large vein. Medicine that is inserted into the reservoir goes into the catheter and then into the vein.  How will I care for my incision site? Do not get the incision site wet. Bathe or shower as directed by your health care provider. How is my port accessed? Special steps must be taken to access the port:  Before the port is accessed, a numbing cream can be placed on the skin. This helps numb the skin over the port site.  Your health care provider uses a sterile technique to access the port. ? Your health care provider must put on a mask and sterile gloves. ? The skin over your port is cleaned carefully with an antiseptic and allowed to dry. ? The port is gently pinched between sterile gloves, and a needle is inserted into the port.  Only "non-coring" port needles should be used to access the port. Once the port is accessed, a blood return should be checked. This helps ensure that the port  is in the vein and is not clogged.  If your port needs to remain accessed for a constant infusion, a clear (transparent) bandage will be placed over the needle site. The bandage and needle will need to be changed every week, or as directed by your health care provider.  Keep the bandage covering the needle clean and dry. Do not get it wet. Follow your health care provider's instructions on how to take a shower or bath while the port is accessed.  If your port does not need to stay accessed, no bandage is needed over the port.  What is flushing? Flushing helps keep the port from getting clogged. Follow your health care provider's instructions on how and when to flush the port. Ports are usually flushed with saline solution or a medicine called heparin. The need for flushing will depend on how the port is used.  If the port is used for intermittent medicines or blood draws, the port will need to be flushed: ? After medicines have been given. ? After blood has been drawn. ? As part of routine maintenance.  If a constant infusion is running, the port may not need to be flushed.  How long will my port stay implanted? The port can stay in for as long as your health care provider thinks it is needed. When it is time for the port to come out, surgery will be   done to remove it. The procedure is similar to the one performed when the port was put in. When should I seek immediate medical care? When you have an implanted port, you should seek immediate medical care if:  You notice a bad smell coming from the incision site.  You have swelling, redness, or drainage at the incision site.  You have more swelling or pain at the port site or the surrounding area.  You have a fever that is not controlled with medicine.  This information is not intended to replace advice given to you by your health care provider. Make sure you discuss any questions you have with your health care provider. Document  Released: 02/15/2005 Document Revised: 07/24/2015 Document Reviewed: 10/23/2012 [employer] Interactive Patient Education  2017 [employer]  

## 2017-02-16 NOTE — Patient Instructions (Signed)
[facility] Discharge Instructions for Patients Receiving Chemotherapy  Today you received the following chemotherapy agents Irinotecan, Leucovorin, Fluorouracil, Vectibix.  To help prevent nausea and vomiting after your treatment, we encourage you to take your nausea medicationas prescribed.  If you develop nausea and vomiting that is not controlled by your nausea medication, call the clinic.   BELOW ARE SYMPTOMS THAT SHOULD BE REPORTED IMMEDIATELY:  *FEVER GREATER THAN 100.5 F  *CHILLS WITH OR WITHOUT FEVER  NAUSEA AND VOMITING THAT IS NOT CONTROLLED WITH YOUR NAUSEA MEDICATION  *UNUSUAL SHORTNESS OF BREATH  *UNUSUAL BRUISING OR BLEEDING  TENDERNESS IN MOUTH AND THROAT WITH OR WITHOUT PRESENCE OF ULCERS  *URINARY PROBLEMS  *BOWEL PROBLEMS  UNUSUAL RASH Items with * indicate a potential emergency and should be followed up as soon as possible.  Feel free to call the clinic should you have any questions or concerns. The clinic phone number is [phone].  Please show the [facility] at check-in to the Emergency Department and triage nurse.

## 2017-02-16 NOTE — Telephone Encounter (Signed)
Gave patient avs and calendar with appts per 12/19 los.

## 2017-02-18 ENCOUNTER — Ambulatory Visit (HOSPITAL_BASED_OUTPATIENT_CLINIC_OR_DEPARTMENT_OTHER): Payer: Medicare Other

## 2017-02-18 VITALS — BP 134/76 | HR 95 | Temp 98.6°F | Resp 20

## 2017-02-18 DIAGNOSIS — C186 Malignant neoplasm of descending colon: Secondary | ICD-10-CM

## 2017-02-18 DIAGNOSIS — C187 Malignant neoplasm of sigmoid colon: Secondary | ICD-10-CM

## 2017-02-18 MED ORDER — SODIUM CHLORIDE 0.9% FLUSH
10.0000 mL | INTRAVENOUS | Status: DC | PRN
  Administered 2017-02-18: 10 mL via INTRAVENOUS
  Filled 2017-02-18: qty 10

## 2017-02-18 MED ORDER — HEPARIN SOD (PORK) LOCK FLUSH 100 UNIT/ML IV SOLN
500.0000 [IU] | Freq: Once | INTRAVENOUS | Status: AC | PRN
  Administered 2017-02-18: 500 [IU] via INTRAVENOUS
  Filled 2017-02-18: qty 5

## 2017-02-19 ENCOUNTER — Encounter: Payer: Self-pay | Admitting: Nurse Practitioner

## 2017-02-25 ENCOUNTER — Encounter: Payer: Self-pay | Admitting: Family Medicine

## 2017-02-25 ENCOUNTER — Other Ambulatory Visit: Payer: Self-pay | Admitting: Pharmacist

## 2017-02-25 DIAGNOSIS — C186 Malignant neoplasm of descending colon: Secondary | ICD-10-CM

## 2017-02-28 ENCOUNTER — Other Ambulatory Visit: Payer: Self-pay | Admitting: Hematology

## 2017-02-28 ENCOUNTER — Other Ambulatory Visit: Payer: Self-pay | Admitting: *Deleted

## 2017-02-28 NOTE — Progress Notes (Unsigned)
re

## 2017-03-01 NOTE — Progress Notes (Signed)
[place]  [phone] [phone]  Clinic Follow Up Note   Patient Care Team: Timmothy Euler, MD as PCP - General (Family Medicine) Kathie Rhodes, MD as Consulting Physician (Urology) Truitt Merle, MD as Consulting Physician (Hematology) Leighton Ruff, MD as Consulting Physician (General Surgery) 2017-03-10  CHIEF COMPLAINTS:  Metastatic left colon Cancer  Oncology History   Cancer Staging Cancer of left colon [facility]) Staging form: Colon and Rectum, AJCC 8th Edition - Pathologic stage from 01/14/2017: Stage IVC (pT4a, pN1a, pM1c) - Signed by Truitt Merle, MD on 01/30/2017       Cancer of left colon ([place])   01/11/2017 Imaging    CT AP W Contrast 01/11/17  IMPRESSION: 1. Irregular annular 6.2 x 3.6 cm mass in the sigmoid colon suspicious for primary colonic malignancy. 2. Multiple mildly enlarged lymph nodes in the sigmoid mesenteries suspicious for locoregional nodal metastases. 3. Extensive soft tissue caking throughout the omentum. Smooth peritoneal thickening and enhancement throughout the peritoneal cavity. Moderate volume ascites. 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 4. Small dependent left pleural effusion. 5.  Aortic Atherosclerosis (ICD10-I70.0).  Coronary atherosclerosis.      01/13/2017 Procedure     Flexible sigmoidoscopy Dr. Loletha Carrow 01/13/2017  Findings: A fungating, infiltrative and ulcerated completely obstructing mass was found in the recto-sigmoid colon (about 20 cm from anal verge). The mass was circumferential. Mucosa was biopsied with a cold forceps for histology. One specimen bottle was sent to pathology. The exam was otherwise without abnormality. - Likely malignant completely obstructing tumor in the recto-sigmoid colon. Biopsied. - The examination was otherwise normal.        01/13/2017 Initial Biopsy    Diagnosis 01/13/17  Colon, biopsy, sigmoid mass - POORLY DIFFERENTIATED  ADENOCARCINOMA WITH SIGNET RING CELLS. Microscopic Comment Dr. Saralyn Pilar has reviewed the case. Dr. Loletha Carrow was paged on 01/14/2017.      01/14/2017 Surgery    LAPAROSCOPIC SIGMOIDECTOMY , OMENTECTOMY BIOPSY WITH END COLOSTOMY by Dr. Marcello Moores on 01/14/17      01/14/2017 Pathology Results    Diagnosis 01/14/17  1. Omentum, resection for tumor - METASTATIC ADENOCARCINOMA. 2. Colon, segmental resection for tumor, sigmoid - INVASIVE ADENOCARCINOMA WITH EXTRACELLULAR MUCIN AND SIGNET RING CELLS, POORLY DIFFERENTIATED, SPANNING 4 CM. - TUMOR INVOLVES SEROSA. - RESECTION MARGINS ARE NEGATIVE. - METASTATIC CARCINOMA IN ONE OF FIVE LYMPH NODES (1/5). - SATELLITE NODULES. - SEE ONCOLOGY TABLE.      01/15/2017 Imaging    CT Chest W Contrast  IMPRESSION: 1. No evidence of metastatic disease in the chest. 2. Small left pleural effusion and trace right pleural effusion without obvious pleural metastatic disease. 3. Coronary atherosclerosis in a 3 vessel distribution. 4. Free intraperitoneal air in the upper abdomen related to recent abdominal surgery.      01/18/2017 Initial Diagnosis    Cancer of left colon ([place])      02/02/2017 -  Chemotherapy    FOLFIRI every 2 weeks to start on 02/02/17, started with reduced dose, due to poor toleration further reduced with cycle 2. Added panitumumab with cycle 2.         HISTORY OF PRESENTING ILLNESS: 01/13/17 Thomas Wagner is a 77 year old Caucasian male, without significant past medical history except hypertension, presented with worsening abdominal bloating and pain, nausea, and constipation for the past 2-3 weeks, no significant weight loss.  Patient states his symptoms started about 6 weeks ago, worse in the past few weeks.  His abdominal pain is mainly located in the  left lower quadrant, constant, tolerable.   He was admitted on January 11, 2017.  CT of abdomen and pelvis with contrast which showed a irregular large mass in the sigmoid colon,  multiple enlarged lymph nodes in the sigmoid mesentery, extensive soft tissue caking throughout of the omentum and moderate ascites, suspicious for peritoneal metastasis.  He underwent flexible sigmoidoscopy by Dr. Loletha Carrow today, which showed a complete obstructed sigmoid colon mass, biopsy was taken.  Results are pending.  He was seen by corrective surgeon Dr. Marcello Moores today, she plan to take pt to OR for laparoscopic Hartman's procedure tomorrow.   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 Loel Lofty. Per pt, Dr. Karsten Ro plans to refer him to [facility] for complete surgical resection.   CURRENT THERAPY:   FOLFIRI every 2 weeks to start on 02/02/17, started with reduced dose, due to poor toleration further reduced with cycle 2. Added panitumumab with cycle 2. Held since 03/02/16 due to significant SOB   INTERVAL HISTORY  Thomas Wagner 77 y.o. male is here for a follow up and cycle 3. He presents to the clinic today accompanied by his wife.  He notes he has had significant bloating and trouble breathing. He says he cannot move much without trouble. He does not have oxygen at home. He denies having a fever. He also notes to staying cold constantly.  He noticed lower ankle swelling this morning and a knot on his left knee that is no longer painful. His BM have been mostly normal with some occassionally slowness.  He has had a mild sore throat for about a week now.    REVIEW OF SYSTEMS:   Constitutional: Denies fevers, chills or abnormal night sweats  (+) feeling cold  Eyes: Denies blurriness of vision, double vision or watery eyes Ears, nose, mouth, throat, and face: Denies mucositis (+) mild sore throat Respiratory: Denies cough, wheezes (+) SOB upon movement Cardiovascular: Denies palpitation, chest discomfort (+) bilateral ankle swelling Gastrointestinal:  Denies nausea, heartburn or change in bowel habits (+) colostomy bag (+)  soreness from incision (+) bloating  Skin: Denies abnormal skin rashes (+) knot on left knee Lymphatics: Denies new lymphadenopathy or easy bruising Neurological:Denies numbness, tingling or new weaknesses Behavioral/Psych: Mood is stable, no new changes  All other systems were reviewed with the patient and are negative.   MEDICAL HISTORY:  Past Medical History:  Diagnosis Date  . Anemia   . Cancer ([place]) 12/2016   coloc and stomach  . History of traumatic subdural hematoma    08-05-2005  --- pt fell off roof--- per imaging right posteroparietal-right parafalcine subdural hematoma w/ concussion --- per pt no residual  . Hypertension   . Nocturia   . Penile mass   . Phimosis   . Pre-diabetes   . Wears dentures    upper    SURGICAL HISTORY: Past Surgical History:  Procedure Laterality Date  . CIRCUMCISION N/A 01/03/2017   Procedure: CIRCUMCISION ADULT/ POSSIBLE PENILE BIOPSY;  Surgeon: Kathie Rhodes, MD;  Location: [facility];  Service: Urology;  Laterality: N/A;  . COLON RESECTION N/A 01/14/2017   Procedure: LAPAROSCOPIC SIGMOIDECTOMY , OMENTECTOMY BIOPSY WITH END COLOSTOMY;  Surgeon: Leighton Ruff, MD;  Location: [facility];  Service: General;  Laterality: N/A;  . FLEXIBLE SIGMOIDOSCOPY N/A 01/13/2017   Procedure: FLEXIBLE SIGMOIDOSCOPY;  Surgeon: Doran Stabler, MD;  Location: [facility];  Service: Gastroenterology;  Laterality: N/A;  .  PORTACATH PLACEMENT N/A 01/17/2017   Procedure: INSERTION PORT-A-CATH;  Surgeon: Excell Seltzer, MD;  Location: [facility];  Service: General;  Laterality: N/A;    SOCIAL HISTORY: Social History   Socioeconomic History  . Marital status: Married    Spouse name: Not on file  . Number of children: Not on file  . Years of education: Not on file  . Highest education level: Not on file  Social Needs  . Financial resource strain: Not on file  . Food insecurity - worry: Not on file  . Food insecurity - inability: Not on  file  . Transportation needs - medical: Not on file  . Transportation needs - non-medical: Not on file  Occupational History  . Not on file  Tobacco Use  . Smoking status: Former Smoker    Years: 5.00    Types: Cigarettes    Last attempt to quit: 12/24/1973    Years since quitting: 43.2  . Smokeless tobacco: Former Systems developer    Types: [place] date: 12/25/1978  Substance and Sexual Activity  . Alcohol use: No  . Drug use: No  . Sexual activity: Not Currently  Other Topics Concern  . Not on file  Social History Narrative  . Not on file    FAMILY HISTORY: Family History  Problem Relation Age of Onset  . Pancreatic cancer Mother   . Pancreatic disease Mother   . Cancer Mother 27       pancreatic cancer  . Other Daughter        Precancerous lesions on colonoscopy  . Hypertension Daughter   . Other Son        Precancerous lesion on colonoscopy  . Arrhythmia Son   . Congestive Heart Failure Father   . Valvular heart disease Sister   . Breast cancer Sister   . Myasthenia gravis Brother   . Breast cancer Sister   . Healthy Sister   . Healthy Sister   . Prostate cancer Brother 59  . Healthy Brother   . Healthy Daughter   . Healthy Son     ALLERGIES:  has No Known Allergies.  MEDICATIONS:  Current Outpatient Medications  Medication Sig Dispense Refill  . amLODipine (NORVASC) 5 MG tablet Take 2.5 mg by mouth every evening.    . benazepril (LOTENSIN) 20 MG tablet Take 10 mg by mouth every evening.    Marland Kitchen ibuprofen (ADVIL,MOTRIN) 200 MG tablet Take 400 mg 2 (two) times daily as needed by mouth.    . lidocaine-prilocaine (EMLA) cream Apply 1 application topically as needed. 30 g 0  . loperamide (IMODIUM A-D) 2 MG tablet Take 2 at diarrhea onset , then 1 every 2hr until 12hrs with no BM. May take 2 every 4hrs at night. If diarrhea recurs repeat. 100 tablet 1  . megestrol (MEGACE) 40 MG/ML suspension Take 16 mLs (640 mg total) by mouth daily. 240 mL 1  . mirtazapine  (REMERON) 15 MG tablet Take 1 tablet (15 mg total) by mouth at bedtime. 30 tablet 1  . ondansetron (ZOFRAN) 8 MG tablet Take 1 tablet (8 mg total) by mouth 2 (two) times daily as needed for refractory nausea / vomiting. Start on day 3 after chemotherapy. 30 tablet 1  . oxycodone (OXY-IR) 5 MG capsule Take 1-2 capsules (5-10 mg total) by mouth every 4 (four) hours as needed. 30 capsule 0  . prochlorperazine (COMPAZINE) 10 MG tablet Take 1 tablet (10 mg total) by mouth every 6 (six) hours  as needed (NAUSEA). 30 tablet 1   No current facility-administered medications for this visit.    Facility-Administered Medications Ordered in Other Visits  Medication Dose Route Frequency Provider Last Rate Last Dose  . alteplase (CATHFLO ACTIVASE) injection 2 mg  2 mg Intracatheter Once PRN Truitt Merle, MD      . sodium chloride flush (NS) 0.9 % injection 10 mL  10 mL Intravenous PRN Truitt Merle, MD   10 mL at 2017/03/31 1129  . sodium chloride flush (NS) 0.9 % injection 10 mL  10 mL Intracatheter PRN Truitt Merle, MD        PHYSICAL EXAMINATION: ECOG PERFORMANCE STATUS: 3  Vitals:   31-Mar-2017 0843  BP: 138/80  Pulse: 63  Resp: (!) 21  Temp: 97.7 F (36.5 C)  SpO2: (!) 87%   Filed Weights   2017/03/31 0843  Weight: 183 lb 9.6 oz (83.3 kg)    GENERAL:alert, no distress and comfortable SKIN: skin color, texture, turgor are normal, no rashes or significant lesions EYES: normal, conjunctiva are pink and non-injected, sclera clear OROPHARYNX:no exudate, no erythema and lips, buccal mucosa, and tongue normal  NECK: supple, thyroid normal size, non-tender, without nodularity LYMPH:  no palpable lymphadenopathy in the cervical, axillary or inguinal LUNGS: clear to auscultation and percussion with normal breathing effort HEART: regular rate & rhythm and no murmurs and no lower extremity edema ABDOMEN:abdomen soft, non-tender and normal bowel sounds    (+) Colostomy bag on left abdomen with yellow loose stool (+)  small ascites  Musculoskeletal:no cyanosis of digits and no clubbing  PSYCH: alert & oriented x 3 with fluent speech NEURO: no focal motor/sensory deficits  LABORATORY DATA:  I have reviewed the data as listed CBC Latest Ref Rng & Units 03/31/2017 02/16/2017 02/02/2017  WBC 4.0 - 10.3 10e3/uL 5.9 8.0 9.7  Hemoglobin 13.0 - 17.1 g/dL 11.9(L) 11.7(L) 11.8(L)  Hematocrit 38.4 - 49.9 % 36.1(L) 35.4(L) 35.6(L)  Platelets 140 - 400 10e3/uL 267 336 532(H)    CMP Latest Ref Rng & Units 03/31/17 02/16/2017 02/02/2017  Glucose 70 - 140 mg/dl 134 95 115  BUN 7.0 - 26.0 mg/dL 13.2 15.7 13.8  Creatinine 0.7 - 1.3 mg/dL 0.9 1.0 0.9  Sodium 136 - 145 mEq/L 137 133(L) 137  Potassium 3.5 - 5.1 mEq/L 3.8 3.8 4.1  Chloride 101 - 111 mmol/L - - -  CO2 22 - 29 mEq/L 23 21(L) 22  Calcium 8.4 - 10.4 mg/dL 9.2 9.0 9.1  Total Protein 6.4 - 8.3 g/dL 7.0 6.8 7.2  Total Bilirubin 0.20 - 1.20 mg/dL 0.49 0.37 0.31  Alkaline Phos 40 - 150 U/L 84 71 71  AST 5 - 34 U/L 12 13 14   ALT 0 - 55 U/L 10 9 11      CEA  01/14/17: 3.2 01/27/17: 4.22 02/16/17: 4.79   PATHOLOGY   Diagnosis 01/14/17  1. Omentum, resection for tumor - METASTATIC ADENOCARCINOMA. 2. Colon, segmental resection for tumor, sigmoid - INVASIVE ADENOCARCINOMA WITH EXTRACELLULAR MUCIN AND SIGNET RING CELLS, POORLY DIFFERENTIATED, SPANNING 4 CM. - TUMOR INVOLVES SEROSA. - RESECTION MARGINS ARE NEGATIVE. - METASTATIC CARCINOMA IN ONE OF FIVE LYMPH NODES (1/5). - SATELLITE NODULES. - SEE ONCOLOGY TABLE. Microscopic Comment 2. COLON AND RECTUM (INCLUDING TRANS-ANAL RESECTION): Specimen: Sigmoid colon. Procedure: Segmental resection. Tumor site: Sigmoid. Specimen integrity: Intact. Macroscopic tumor perforation: Not identified. Invasive tumor: Maximum size: 4 cm. Histologic type(s): Invasive adenocarcinoma with extracellular mucin and signet ring cells. Histologic grade and differentiation: G3: poorly differentiated/high grade  Type of  polyp in which invasive carcinoma arose: N/A. Microscopic extension of invasive tumor: Through serosa. Lymph-Vascular invasion: Present. Peri-neural invasion: Not identified. Tumor deposit(s) (discontinuous extramural extension): Present. Resection margins: Proximal margin: Negative. Distal margin: Negative. Circumferential (radial) (posterior ascending, posterior descending; lateral 1 of 3 Supplemental copy SUPPLEMENTAL for GIANLUCAS, EVENSON E [phone]) Microscopic Comment(continued) and posterior mid-rectum; and entire lower 1/3 rectum): N/A. Mesenteric margin (sigmoid and transverse): Negative. Distance closest margin (if all above margins negative): 3 cm (mesenteric). Treatment effect (neo-adjuvant therapy): N/A. Additional polyp(s): None. Non-neoplastic findings: None. Lymph nodes: number examined 5; number positive: 1 Pathologic Staging: pT4a, pNa, pM1a Ancillary studies: MSI and MMR will be ordered. JULIA MANNY   Diagnosis 01/13/17  Colon, biopsy, sigmoid mass - POORLY DIFFERENTIATED ADENOCARCINOMA WITH SIGNET RING CELLS. Microscopic Comment Dr. Saralyn Pilar has reviewed the case. Dr. Loletha Carrow was paged on 01/14/2017.    Diagnosis 01/03/17  Foreskin, adult, with mass - INVASIVE SQUAMOUS CELL CARCINOMA, WELL DIFFERENTIATED, SPANNING 1.3 CM. - TUMOR INVADES 0.2 CM. - TUMOR IS PRESENT AT ONE OF THE LATERAL INKED MARGINS. - DEEP MARGINS ARE NEGATIVE. - BALANITIS XEROTIC OBLITERANS. Microscopic Comment Dr. Lyndon Code has reviewed the case.    PROCEDURES   Flexible sigmoidoscopy Dr. Loletha Carrow 01/13/2017  Findings: A fungating, infiltrative and ulcerated completely obstructing mass was found in the recto-sigmoid colon (about 20 cm from anal verge). The mass was circumferential. Mucosa was biopsied with a cold forceps for histology. One specimen bottle was sent to pathology. The exam was otherwise without abnormality. - Likely malignant completely obstructing tumor in the  recto-sigmoid colon. Biopsied. - The examination was otherwise normal.     RADIOGRAPHIC STUDIES: I have personally reviewed the radiological images as listed and agreed with the findings in the report. No results found.   ASSESSMENT & PLAN:  Thomas Wagner is a 77 y.o. caucasian male without significant past medical history, except hypertension.   1. Left colon Cancer, pT4aN1aM1c, with peritoneum metastatic, MSI-stable, KRS/NRS/BRAS wild type  -I previously reviewed his CT AP scan, and endoscopy findings,  and surgical pathology findings which showed metastatic sigmoid colon cancer to peritoneum. -CT Chest showed no evidence of mets in the chest.  -We have discussed his case in our GI tumor board.  During the diffuse large burden peritoneal metastasis, he is at risk for his bowel obstruction, will monitor closely. -We discussed the incurable nature of his disease.  Given his advanced age, large disease burden in the peritoneum, he is unlikely a candidate for [facility], which may potentially cure peritoneum carcinomatosis from colon cancer. -We reviewed the natural history of metastatic colon cancer and overall better outcome of her left side: The right side colon cancer.  We discussed treatment options.  I recommend him to consider systemic chemotherapy, we reviewed chemo regimens, such as CAPOX, FOLFOX or FOLFIRI.  Given his left side colon cancer, I recommend him to start first-lineFOLFIRI. Side effects were discussed in great detail He agreed to proceed.  -Given his metastatic disease, the goal of therapy would be palliative, to control his disease, prolong his life.  -He started FOLFIRI every 2 weeks on 02/02/17.  Given his advanced age, I reduced his chemo dose for first cycle, to see if he can tolerate it well. He tolerated the first cycle FOLFIRI not very well, we slightly decreased dose with cycle 2. -I reviewed his FO result, which showed KRAS/NRAS/BRAS wild type, and MSI-stable. So  immunotherapy is not an option for now. But he would greatly benefit  from EGFR inhibitor, and I recommended adding panitumumab to his chemo, starting from cycle 2 chemo.   -Due to his significant dyspnea andhypoxia, I will hold chemotherapy today -I will do a stat CT Chest scan to further investigate possible PE   2. Dyspnea and hypoxia -He developed significant dyspnea on exertion and at rest about 2 days ago, no significant chest pain, he was found to have pulse ox 87% on room air, exam showed no evidence of wheezing or pleural effusion, he abdomen is soft and has small ascites, unlikely cause dyspnea.  I highly suspect that this is pulmonary embolism and have ordered a stat CT, chest  -he was put in a bed in our infusion room with oxygen and being monitored closely   2. Low appetite, Weight loss -Secondary to cancer  -I encouraged him to increase ensure boost intake up to 3-4 bottles a day  -I suggest Megace to help stimulate his appetite. He is fine to use hemp oil if needed.  -I recommend increasing ensure/boost to 2-3 bottles per day, increase eating, and trying to be active. He did not notice much improvement in appetite on megace.  -I will start remeron to boost his appetite and improve his mood.  -Sent urgent message to dietician to see in infusion and follow closely to combat further weight loss.    3. 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 He had positive margins after resection.  -He is being followed by Dr. Loel Lofty, urologist.  -I will consult with Dr. Loel Lofty on possible further intervention.   4. Anemia  -His recent labs shows mild anemia, likely related to his cancer.  - If iron study results are low I suggest he start on oral iron Ferrous sulfate.  -stable  5. Goal of care discussion  -We again discussed the incurable nature of his cancer, and the overall poor prognosis, especially if he does not have good  response to chemotherapy or progress on chemo -The patient understands the goal of care is palliative. -I recommend DNR/DNI, he will think about it   6. Lower Abdominal pain  -I refilled oxycodone for mild-moderate lower abdominal pain.    PLAN:  -Pulse ox of 87%, will put him on oxygen and give IV fluids with zofran and dexa  -hold chemo treatment today -STAT CT chest W WO Contrast    No orders of the defined types were placed in this encounter.   All questions were answered. The patient knows to call the clinic with any problems, questions or concerns. I spent 40 minutes counseling the patient face to face. The total time spent in the appointment was 60 minutes (including the resuscitation below) and more than 50% was on counseling.     Truitt Merle, MD 03/08/2017    This document serves as a record of services personally performed by Truitt Merle, MD. It was created on her behalf by Joslyn Devon, a trained medical scribe. The creation of this record is based on the scribe's personal observations and the provider's statements to them.    I have reviewed the above documentation for accuracy and completeness, and I agree with the above.   Addendum  Patient was ready to go to radiology for CT scan, he went to bathroom to void, and passed out in the bathroom.  He was found to be sitting on the ground, no significant injury, he was brought back to the room immediately, rapid  response was called.  During the evaluation, the patient became lethargic, lost pulse, CODE BLUE was called.  We started CPR around 12:09pm, 2 doses of epinephrine was given through his port.   I talked to patient's wife regarding his CODE STATUS.  She clearly stated that patient did not want resuscitation given his incurable nature of cancer.  She called her daughter who agreed.  Patient actually gained pulse back spontaneously around 12:15pm, he was not intubated. Pt's wife stayed with him.  He quickly lost pulse and  consciousness again, and was pronounced death on 12:25pm.  We expressed our condolences to patient's wife.  Chaplain was with patient's wife during this process. She appreciated out care.  Truitt Merle  March 19, 2017

## 2017-03-02 ENCOUNTER — Ambulatory Visit (HOSPITAL_COMMUNITY): Admission: RE | Admit: 2017-03-02 | Payer: Medicare Other | Source: Ambulatory Visit

## 2017-03-02 ENCOUNTER — Telehealth: Payer: Self-pay | Admitting: Hematology

## 2017-03-02 ENCOUNTER — Other Ambulatory Visit (HOSPITAL_BASED_OUTPATIENT_CLINIC_OR_DEPARTMENT_OTHER): Payer: Medicare Other

## 2017-03-02 ENCOUNTER — Encounter: Payer: Self-pay | Admitting: Hematology

## 2017-03-02 ENCOUNTER — Ambulatory Visit (HOSPITAL_BASED_OUTPATIENT_CLINIC_OR_DEPARTMENT_OTHER): Payer: Medicare Other

## 2017-03-02 ENCOUNTER — Ambulatory Visit (HOSPITAL_BASED_OUTPATIENT_CLINIC_OR_DEPARTMENT_OTHER): Payer: Medicare Other | Admitting: Hematology

## 2017-03-02 ENCOUNTER — Ambulatory Visit: Payer: Medicare Other

## 2017-03-02 ENCOUNTER — Encounter: Payer: Self-pay | Admitting: *Deleted

## 2017-03-02 VITALS — BP 138/80 | HR 63 | Temp 97.7°F | Resp 21 | Wt 183.6 lb

## 2017-03-02 VITALS — BP 114/73 | HR 94 | Temp 98.6°F | Resp 20

## 2017-03-02 DIAGNOSIS — C187 Malignant neoplasm of sigmoid colon: Secondary | ICD-10-CM

## 2017-03-02 DIAGNOSIS — R1030 Lower abdominal pain, unspecified: Secondary | ICD-10-CM | POA: Diagnosis not present

## 2017-03-02 DIAGNOSIS — D500 Iron deficiency anemia secondary to blood loss (chronic): Secondary | ICD-10-CM

## 2017-03-02 DIAGNOSIS — R634 Abnormal weight loss: Secondary | ICD-10-CM | POA: Diagnosis not present

## 2017-03-02 DIAGNOSIS — C186 Malignant neoplasm of descending colon: Secondary | ICD-10-CM

## 2017-03-02 DIAGNOSIS — C786 Secondary malignant neoplasm of retroperitoneum and peritoneum: Secondary | ICD-10-CM

## 2017-03-02 DIAGNOSIS — R0600 Dyspnea, unspecified: Secondary | ICD-10-CM

## 2017-03-02 DIAGNOSIS — C609 Malignant neoplasm of penis, unspecified: Secondary | ICD-10-CM

## 2017-03-02 DIAGNOSIS — D649 Anemia, unspecified: Secondary | ICD-10-CM

## 2017-03-02 DIAGNOSIS — R0902 Hypoxemia: Secondary | ICD-10-CM | POA: Diagnosis not present

## 2017-03-02 DIAGNOSIS — R630 Anorexia: Secondary | ICD-10-CM

## 2017-03-02 DIAGNOSIS — R0602 Shortness of breath: Secondary | ICD-10-CM

## 2017-03-02 LAB — COMPREHENSIVE METABOLIC PANEL
ALT: 10 U/L (ref 0–55)
AST: 12 U/L (ref 5–34)
Albumin: 2.8 g/dL — ABNORMAL LOW (ref 3.5–5.0)
Alkaline Phosphatase: 84 U/L (ref 40–150)
Anion Gap: 11 mEq/L (ref 3–11)
BUN: 13.2 mg/dL (ref 7.0–26.0)
CHLORIDE: 103 meq/L (ref 98–109)
CO2: 23 meq/L (ref 22–29)
Calcium: 9.2 mg/dL (ref 8.4–10.4)
Creatinine: 0.9 mg/dL (ref 0.7–1.3)
EGFR: >60 mL/min/{1.73_m2} (ref >60)
GLUCOSE: 134 mg/dL (ref 70–140)
POTASSIUM: 3.8 meq/L (ref 3.5–5.1)
SODIUM: 137 meq/L (ref 136–145)
Total Bilirubin: 0.49 mg/dL (ref 0.20–1.20)
Total Protein: 7 g/dL (ref 6.4–8.3)

## 2017-03-02 LAB — CBC WITH DIFFERENTIAL/PLATELET
BASO%: 1 % (ref 0.0–2.0)
BASOS ABS: 0.1 10*3/uL (ref 0.0–0.1)
EOS ABS: 0.1 10*3/uL (ref 0.0–0.5)
EOS%: 1.5 % (ref 0.0–7.0)
HCT: 36.1 % — ABNORMAL LOW (ref 38.4–49.9)
HGB: 11.9 g/dL — ABNORMAL LOW (ref 13.0–17.1)
LYMPH%: 9.6 % — AB (ref 14.0–49.0)
MCH: 26.8 pg — AB (ref 27.2–33.4)
MCHC: 32.9 g/dL (ref 32.0–36.0)
MCV: 81.6 fL (ref 79.3–98.0)
MONO#: 0.9 10*3/uL (ref 0.1–0.9)
MONO%: 15.5 % — AB (ref 0.0–14.0)
NEUT#: 4.3 10*3/uL (ref 1.5–6.5)
NEUT%: 72.4 % (ref 39.0–75.0)
Platelets: 267 10*3/uL (ref 140–400)
RBC: 4.42 10*6/uL (ref 4.20–5.82)
RDW: 16.3 % — ABNORMAL HIGH (ref 11.0–14.6)
WBC: 5.9 10*3/uL (ref 4.0–10.3)
lymph#: 0.6 10*3/uL — ABNORMAL LOW (ref 0.9–3.3)

## 2017-03-02 LAB — MAGNESIUM: Magnesium: 1.9 mg/dl (ref 1.5–2.5)

## 2017-03-02 MED ORDER — HEPARIN SOD (PORK) LOCK FLUSH 100 UNIT/ML IV SOLN
500.0000 [IU] | Freq: Once | INTRAVENOUS | Status: AC | PRN
  Administered 2017-03-02: 500 [IU]
  Filled 2017-03-02: qty 5

## 2017-03-02 MED ORDER — SODIUM CHLORIDE 0.9% FLUSH
10.0000 mL | INTRAVENOUS | Status: DC | PRN
  Administered 2017-03-02 (×2): 10 mL via INTRAVENOUS
  Filled 2017-03-02: qty 10

## 2017-03-02 MED ORDER — SODIUM CHLORIDE 0.9% FLUSH
10.0000 mL | INTRAVENOUS | Status: DC | PRN
  Filled 2017-03-02: qty 10

## 2017-03-02 MED ORDER — DEXAMETHASONE SODIUM PHOSPHATE 10 MG/ML IJ SOLN
10.0000 mg | Freq: Once | INTRAMUSCULAR | Status: AC
  Administered 2017-03-02: 10 mg via INTRAVENOUS

## 2017-03-02 MED ORDER — SODIUM CHLORIDE 0.9 % IV SOLN
Freq: Once | INTRAVENOUS | Status: AC
  Administered 2017-03-02: 09:00:00 via INTRAVENOUS

## 2017-03-02 MED ORDER — ALTEPLASE 2 MG IJ SOLR
2.0000 mg | Freq: Once | INTRAMUSCULAR | Status: DC | PRN
  Filled 2017-03-02: qty 2

## 2017-03-02 MED ORDER — SODIUM CHLORIDE 0.9 % IV SOLN: Freq: Once | INTRAVENOUS | Status: DC

## 2017-03-02 MED ORDER — ONDANSETRON HCL 4 MG/2ML IJ SOLN
8.0000 mg | Freq: Once | INTRAMUSCULAR | Status: AC
  Administered 2017-03-02: 8 mg via INTRAVENOUS

## 2017-03-16 ENCOUNTER — Ambulatory Visit: Payer: Medicare Other

## 2017-03-16 ENCOUNTER — Encounter: Payer: Medicare Other | Admitting: Nutrition

## 2017-03-16 ENCOUNTER — Other Ambulatory Visit: Payer: Medicare Other

## 2017-03-16 ENCOUNTER — Ambulatory Visit: Payer: Medicare Other | Admitting: Nurse Practitioner

## 2017-03-19 ENCOUNTER — Other Ambulatory Visit: Payer: Self-pay | Admitting: Nurse Practitioner

## 2017-03-28 ENCOUNTER — Ambulatory Visit: Payer: Medicare Other | Admitting: Family Medicine

## 2017-04-01 NOTE — Telephone Encounter (Signed)
No 1/2 los at check out

## 2017-04-01 NOTE — ED Provider Notes (Deleted)
I was called to [place] for CPR.  Patient was in the infusion room to get IV fluids reported went to the bathroom and came back dizzy.  Reportedly had been hypoxic.  Patient had gone unresponsive and lost pulses.  Upon my arrival patient was getting CPR and being bagged.  CRNA was there and decision was made to intubate.  However before intubation the patient had a return of pulse.  Breathing spontaneously.  Dr. Burr Medico was also present during the CPR.  She had discussed with the family and patient was made DNR/DNI.  Plan was to keep the patient in the infusion center even though he had had a worsening bradycardia.  Likely was preterminal but may have continued return of vitals.       Davonna Belling, MD 2017-03-08 1453

## 2017-04-01 NOTE — Patient Instructions (Signed)
Nausea, Adult Feeling sick to your stomach (nausea) means that your stomach is upset or you feel like you have to throw up (vomit). Feeling sick to your stomach is usually not serious, but it may be an early sign of a more serious medical problem. As you feel sicker to your stomach, it can lead to throwing up (vomiting). If you throw up, or if you are not able to drink enough fluids, there is a risk of dehydration. Dehydration can make you feel tired and thirsty, have a dry mouth, and pee (urinate) less often. Older adults and people who have other diseases or a weak defense (immune) system have a higher risk of dehydration. The main goal of treating this condition is to:  Limit how often you feel sick to your stomach.  Prevent throwing up and dehydration.  Follow these instructions at home: Follow instructions from your doctor about how to care for yourself at home. Eating and drinking Follow these recommendations as told by your doctor:  Take an oral rehydration solution (ORS). This is a drink that is sold at pharmacies and stores.  Drink clear fluids in small amounts as you are able, such as: ? Water. ? Ice chips. ? Fruit juice that has water added (diluted fruit juice). ? Low-calorie sports drinks.  Eat bland, easy to digest foods in small amounts as you are able, such as: ? Bananas. ? Applesauce. ? Rice. ? Lean meats. ? Toast. ? Crackers.  Avoid drinking fluids that contain a lot of sugar or caffeine.  Avoid alcohol.  Avoid spicy or fatty foods.  General instructions  Drink enough fluid to keep your pee (urine) clear or pale yellow.  Wash your hands often. If you cannot use soap and water, use hand sanitizer.  Make sure that all people in your household wash their hands well and often.  Rest at home while you get better.  Take over-the-counter and prescription medicines only as told by your doctor.  Breathe slowly and deeply when you feel sick to your  stomach.  Watch your condition for any changes.  Keep all follow-up visits as told by your doctor. This is important. Contact a doctor if:  You have a headache.  You have new symptoms.  You feel sicker to your stomach.  You have a fever.  You feel light-headed or dizzy.  You throw up.  You are not able to keep fluids down. Get help right away if:  You have pain in your chest, neck, arm, or jaw.  You feel very weak or you pass out (faint).  You have throw up that is bright red or looks like coffee grounds.  You have bloody or black poop (stools), or poop that looks like tar.  You have a very bad headache, a stiff neck, or both.  You have very bad pain, cramping, or bloating in your belly.  You have a rash.  You have trouble breathing or you are breathing very quickly.  Your heart is beating very quickly.  Your skin feels cold and clammy.  You feel confused.  You have pain while peeing.  You have signs of dehydration, such as: ? Dark pee, or very little or no pee. ? Cracked lips. ? Dry mouth. ? Sunken eyes. ? Sleepiness. ? Weakness. These symptoms may be an emergency. Do not wait to see if the symptoms will go away. Get medical help right away. Call your local emergency services (911 in the [place] Do not drive yourself to   the hospital. This information is not intended to replace advice given to you by your health care provider. Make sure you discuss any questions you have with your health care provider. Document Released: 02/04/2011 Document Revised: 07/24/2015 Document Reviewed: 10/22/2014 Elsevier Interactive Patient Education  2018 [employer]  

## 2017-04-01 NOTE — Progress Notes (Signed)
1133 Patient saturation as recorded on room air. Janifer Adie, RN (Dr. Ernestina Penna nurse) made aware of room air and 2L nasal cannula readings. States she will give RN a call back after consulting with Dr. Burr Medico.  [place] advised by Janifer Adie that patient will transport to CT on oxygen. Patient transferred from wall O2 to tank O2 on 2L Nasal cannula. Lavella Lemons RN and [employer] NT assisting; wife at bedside.  1200 RN called to room stating by statement of "they need you next door". Patient noted lying Supine, semi-fowlers, talking to nurses at bedside. Non-rebreather placed and patient verbalizes "take this thing off of me, I cant breathe"  1206 patient noted to have altered mental status.  1209 Code blue initiated.

## 2017-04-01 NOTE — Patient Instructions (Signed)
Implanted Port Home Guide An implanted port is a type of central line that is placed under the skin. Central lines are used to provide IV access when treatment or nutrition needs to be given through a person's veins. Implanted ports are used for long-term IV access. An implanted port may be placed because:  You need IV medicine that would be irritating to the small veins in your hands or arms.  You need long-term IV medicines, such as antibiotics.  You need IV nutrition for a long period.  You need frequent blood draws for lab tests.  You need dialysis.  Implanted ports are usually placed in the chest area, but they can also be placed in the upper arm, the abdomen, or the leg. An implanted port has two main parts:  Reservoir. The reservoir is round and will appear as a small, raised area under your skin. The reservoir is the part where a needle is inserted to give medicines or draw blood.  Catheter. The catheter is a thin, flexible tube that extends from the reservoir. The catheter is placed into a large vein. Medicine that is inserted into the reservoir goes into the catheter and then into the vein.  How will I care for my incision site? Do not get the incision site wet. Bathe or shower as directed by your health care provider. How is my port accessed? Special steps must be taken to access the port:  Before the port is accessed, a numbing cream can be placed on the skin. This helps numb the skin over the port site.  Your health care provider uses a sterile technique to access the port. ? Your health care provider must put on a mask and sterile gloves. ? The skin over your port is cleaned carefully with an antiseptic and allowed to dry. ? The port is gently pinched between sterile gloves, and a needle is inserted into the port.  Only "non-coring" port needles should be used to access the port. Once the port is accessed, a blood return should be checked. This helps ensure that the port  is in the vein and is not clogged.  If your port needs to remain accessed for a constant infusion, a clear (transparent) bandage will be placed over the needle site. The bandage and needle will need to be changed every week, or as directed by your health care provider.  Keep the bandage covering the needle clean and dry. Do not get it wet. Follow your health care provider's instructions on how to take a shower or bath while the port is accessed.  If your port does not need to stay accessed, no bandage is needed over the port.  What is flushing? Flushing helps keep the port from getting clogged. Follow your health care provider's instructions on how and when to flush the port. Ports are usually flushed with saline solution or a medicine called heparin. The need for flushing will depend on how the port is used.  If the port is used for intermittent medicines or blood draws, the port will need to be flushed: ? After medicines have been given. ? After blood has been drawn. ? As part of routine maintenance.  If a constant infusion is running, the port may not need to be flushed.  How long will my port stay implanted? The port can stay in for as long as your health care provider thinks it is needed. When it is time for the port to come out, surgery will be   done to remove it. The procedure is similar to the one performed when the port was put in. When should I seek immediate medical care? When you have an implanted port, you should seek immediate medical care if:  You notice a bad smell coming from the incision site.  You have swelling, redness, or drainage at the incision site.  You have more swelling or pain at the port site or the surrounding area.  You have a fever that is not controlled with medicine.  This information is not intended to replace advice given to you by your health care provider. Make sure you discuss any questions you have with your health care provider. Document  Released: 02/15/2005 Document Revised: 07/24/2015 Document Reviewed: 10/23/2012 [employer] Interactive Patient Education  2017 [employer]  

## 2017-04-01 DEATH — deceased

## 2017-04-15 ENCOUNTER — Encounter (HOSPITAL_COMMUNITY): Payer: Self-pay

## 2019-02-07 IMAGING — CT CT ABD-PELV W/ CM
2 of 5 series · 15 of 46 positions shown, 17 images · IV contrast (ISOVUE)
Comparison: None.

CLINICAL DATA: Abdominal pain, cramping, bloating and diarrhea for
1 month.

EXAM:
CT ABDOMEN AND PELVIS WITH CONTRAST
TECHNIQUE: Multidetector CT imaging of the abdomen and pelvis was performed
using the standard protocol following bolus administration of
intravenous contrast.
CONTRAST:  100mL BX25OH-4JJ IOPAMIDOL (BX25OH-4JJ) INJECTION 61%

[Series 2: abd/pel with · axial · 0.78mm/px · z∈[+1026,+1470]mm · 12 of 101 slices shown, 14 images]
[im 6/101  soft-tissue]
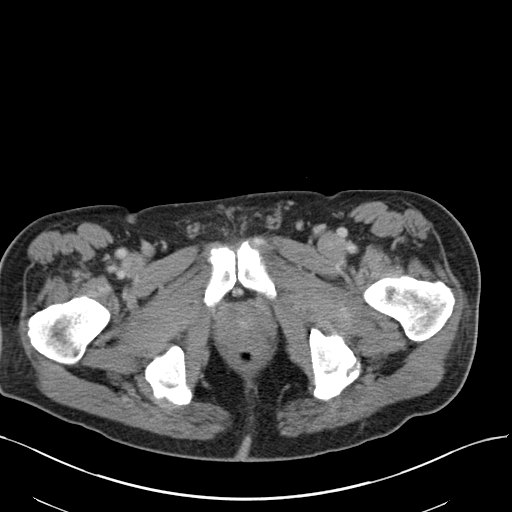
[im 6/101  bone]
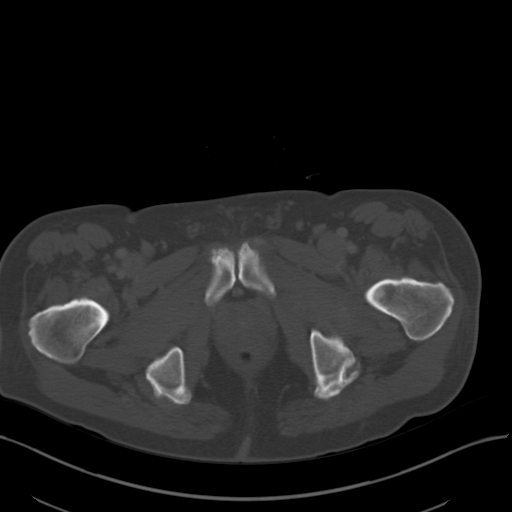
[im 18/101  soft-tissue]
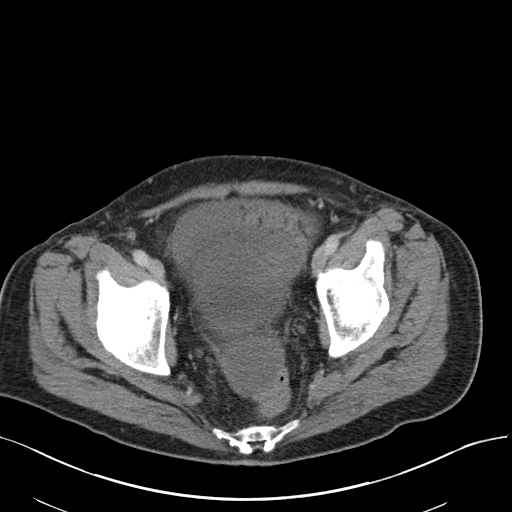
[im 24/101  soft-tissue]
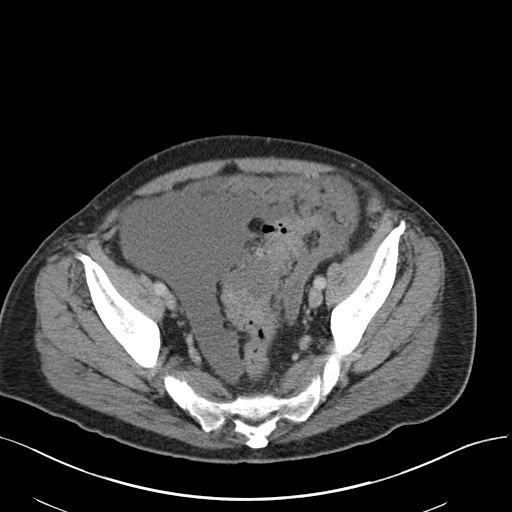
[im 30/101  soft-tissue]
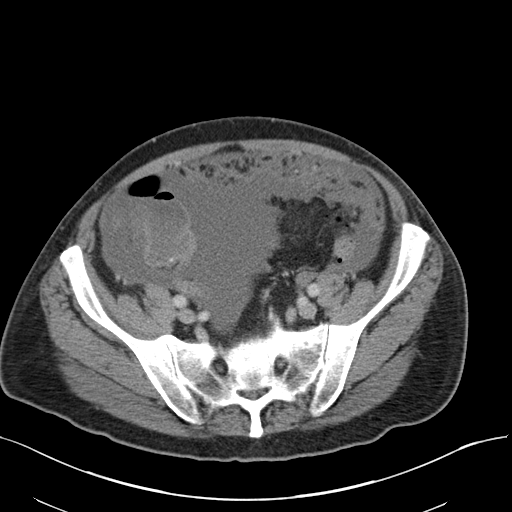
[im 42/101  soft-tissue]
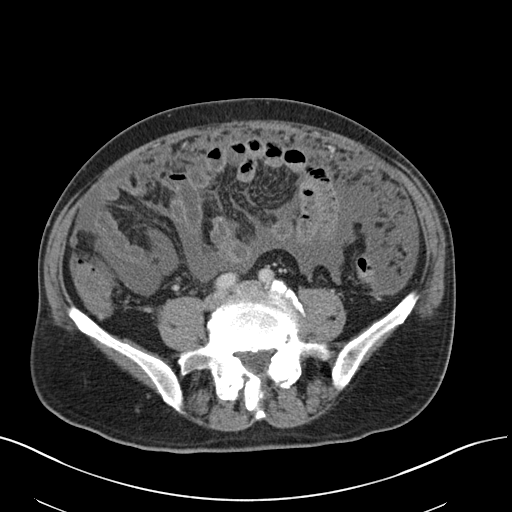
[im 48/101  soft-tissue]
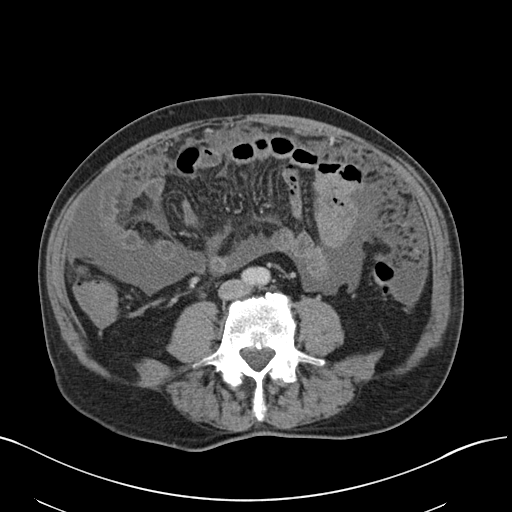
[im 53/101  soft-tissue]
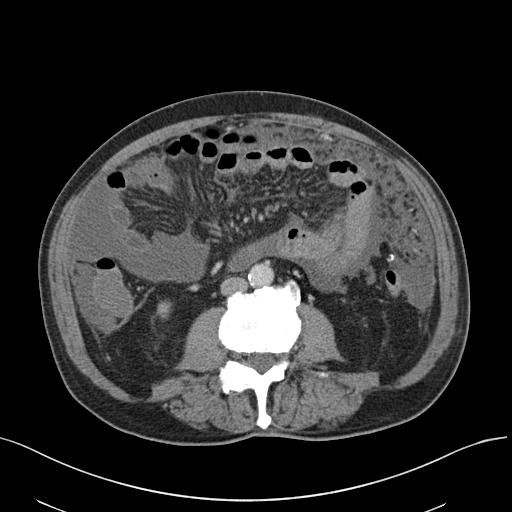
[im 65/101  soft-tissue]
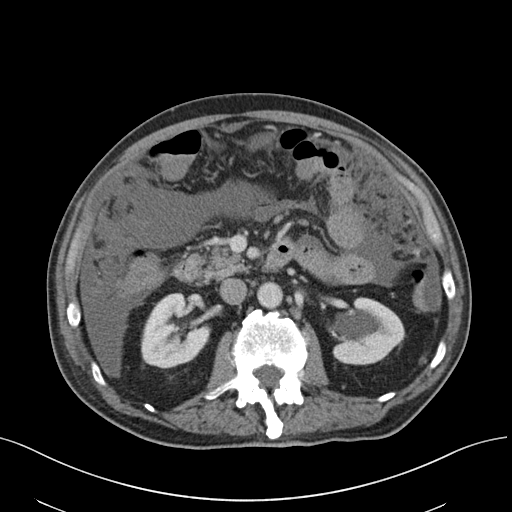
[im 71/101  soft-tissue]
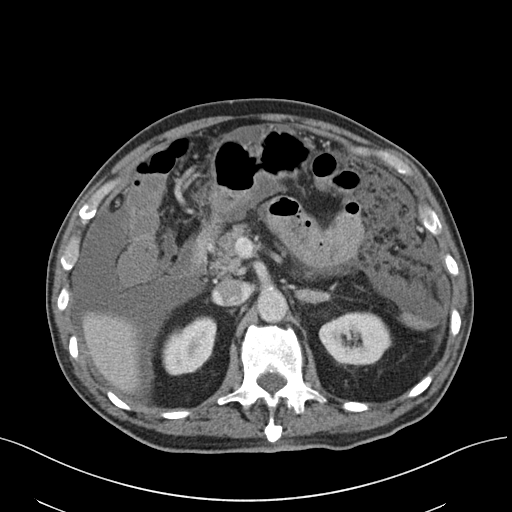
[im 71/101  bone]
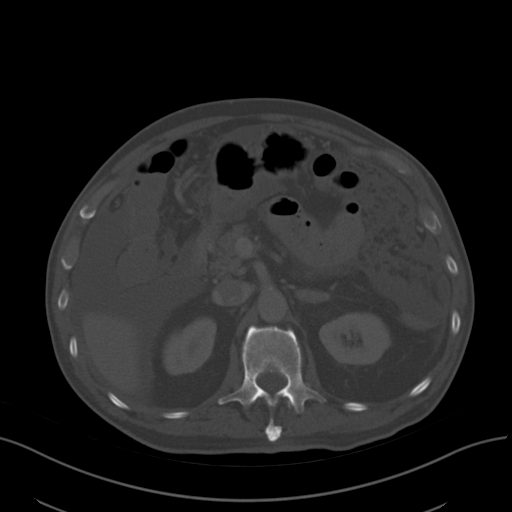
[im 77/101  soft-tissue]
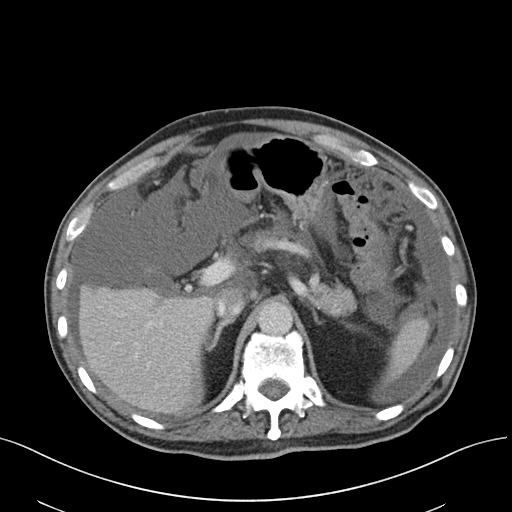
[im 89/101  soft-tissue]
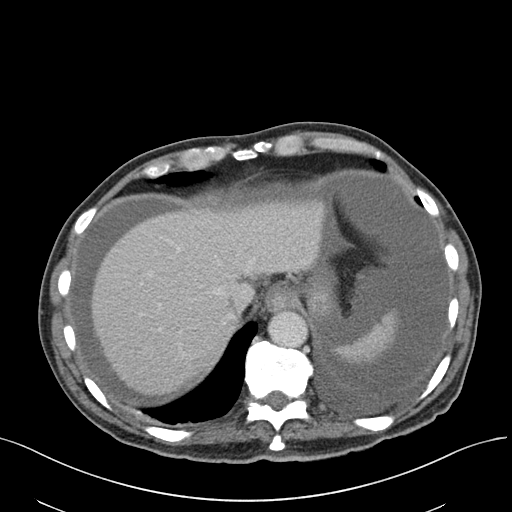
[im 95/101  soft-tissue]
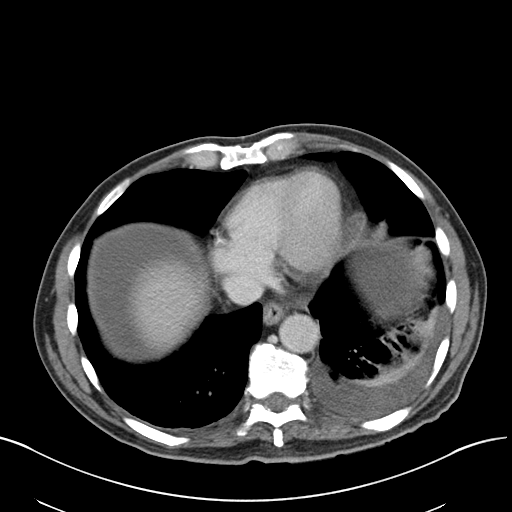

[Series 4: coronal a/|p · coronal · 0.82mm/px · 3 of 137 slices shown]
[im 46/137  soft-tissue]
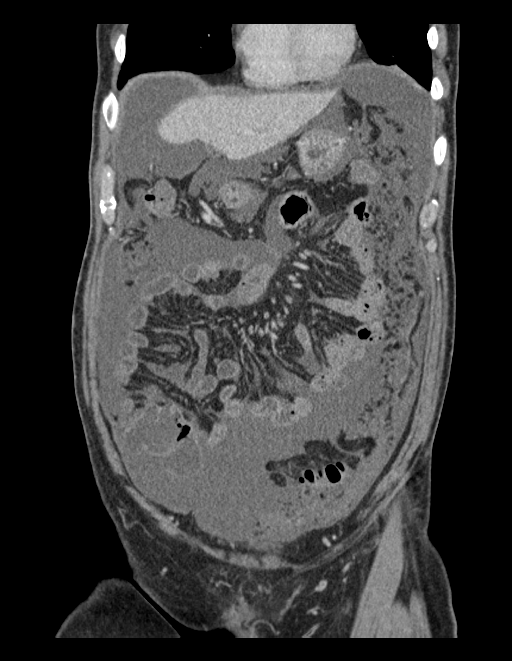
[im 61/137  soft-tissue]
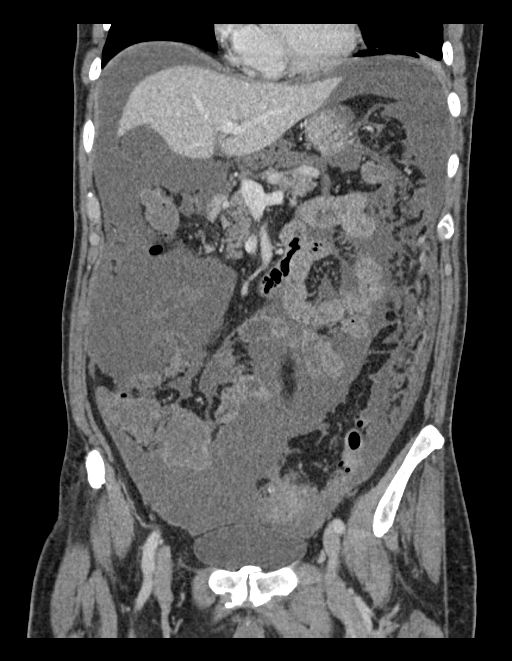
[im 76/137  soft-tissue]
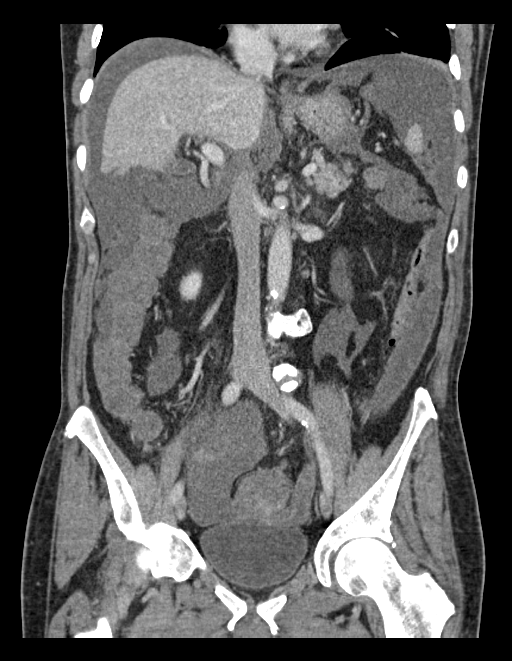

[15 of 46 positions shown; findings below may reference images not displayed]

FINDINGS: Lower chest: Small dependent left pleural effusion. Mild dependent
left lower lobe atelectasis. Coronary atherosclerosis.

Hepatobiliary: Normal liver with no liver mass. Normal gallbladder
with no radiopaque cholelithiasis. No biliary ductal dilatation.

Pancreas: Normal, with no mass or duct dilation.

Spleen: Normal size. No mass.

Adrenals/Urinary Tract: No discrete adrenal nodules. No
hydronephrosis. Small simple parapelvic renal cysts in both kidneys.
No suspicious renal masses. Normal bladder.

Stomach/Bowel: Small hiatal hernia. Otherwise normal nondistended
stomach. Normal caliber small bowel with no small bowel wall
thickening. Normal appendix. There is an irregular annular mass in
the sigmoid colon measuring approximately 6.2 x 3.6 cm (series 2/
image 79). There is moderate left colonic diverticulosis. No
significant large bowel dilatation. No additional sites of large
bowel wall thickening.

Vascular/Lymphatic: Atherosclerotic nonaneurysmal abdominal aorta.
Patent portal, splenic, hepatic and renal veins. There are several
clustered mildly enlarged lymph nodes in the sigmoid mesentery
measuring up to 1.4 cm (series 2/ image 75).

Reproductive: Top-normal size prostate.

Other: There is extensive soft tissue caking throughout the omentum.
Moderate volume ascites. Smooth peritoneal thickening and
enhancement throughout the peritoneal cavity.

Musculoskeletal: No aggressive appearing focal osseous lesions.
Marked thoracolumbar spondylosis.
IMPRESSION: 1. Irregular annular 6.2 x 3.6 cm mass in the sigmoid colon
suspicious for primary colonic malignancy.
2. Multiple mildly enlarged lymph nodes in the sigmoid mesenteries
suspicious for locoregional nodal metastases.
3. Extensive soft tissue caking throughout the omentum. Smooth
peritoneal thickening and enhancement throughout the peritoneal
cavity. Moderate volume ascites. These findings are highly
suspicious for peritoneal carcinomatosis.
4. Small dependent left pleural effusion.
5.  Aortic Atherosclerosis ([id number]).  Coronary atherosclerosis.
